# Patient Record
Sex: Female | Born: 1965 | Marital: Married | Smoking: Never smoker
Health system: Southern US, Community
[De-identification: ages and names within clinical notes are randomized; demographics above are authoritative.]

## PROBLEM LIST (undated history)

## (undated) DIAGNOSIS — M1990 Unspecified osteoarthritis, unspecified site: Secondary | ICD-10-CM

## (undated) DIAGNOSIS — I10 Essential (primary) hypertension: Secondary | ICD-10-CM

## (undated) DIAGNOSIS — J189 Pneumonia, unspecified organism: Secondary | ICD-10-CM

## (undated) HISTORY — PX: ABDOMINAL HYSTERECTOMY: SHX81

## (undated) HISTORY — PX: TUBAL LIGATION: SHX77

---

## 2020-06-29 ENCOUNTER — Ambulatory Visit: Payer: Self-pay

## 2020-06-29 ENCOUNTER — Encounter: Payer: Self-pay | Admitting: Orthopaedic Surgery

## 2020-06-29 ENCOUNTER — Ambulatory Visit: Payer: BC Managed Care – PPO | Admitting: Orthopaedic Surgery

## 2020-06-29 VITALS — Ht 68.5 in | Wt 200.0 lb

## 2020-06-29 DIAGNOSIS — M25552 Pain in left hip: Secondary | ICD-10-CM | POA: Diagnosis not present

## 2020-06-29 NOTE — Progress Notes (Signed)
   Office Visit Note   Patient: Shelly Burns           Date of Birth: [birth year]           MRN: [id number] Visit Date: 06/29/2020              Requested by: No referring provider defined for this encounter. PCP: Pcp, No   Assessment & Plan: Visit Diagnoses:  1. Pain in left hip     Plan: Impression is left hip degenerative joint disease.  We discussed cortisone injection for which the patient would like to proceed.  She will follow up with Dr. Prince Rome for this.  Follow-up with [place] as needed.  Follow-Up Instructions: Return for f/u with Dr. Prince Rome for left hip injection.   Orders:  Orders Placed This Encounter  Procedures  . XR HIP UNILAT W OR W/O PELVIS 2-3 VIEWS LEFT   No orders of the defined types were placed in this encounter.     Procedures: No procedures performed   Clinical Data: No additional findings.   Subjective: Chief Complaint  Patient presents with  . Left Hip - Pain    HPI patient is a pleasant 55 year old female who comes in today with left hip pain.  This began approximately 2 years ago without any known injury or change in activity and has recently worsened.  The pain she has is to the groin and anterior thigh but occasionally into the left lateral hip.  She notes that her symptoms are aggravated with walking as well as putting on her shoes and relieve anything where she flexes her hip.  She has taken Tylenol without relief of symptoms.  She denies any numbness, tingling or burning to the left lower extremity.  No previous hip injection.  Review of Systems as detailed in HPI.  All others reviewed and are negative.   Objective: Vital Signs: Ht 5' 8.5" (1.74 m)   Wt 200 lb (90.7 kg)   BMI 29.97 kg/m   Physical Exam well-developed well-nourished female in no acute distress.  Alert oriented x3.  Ortho Exam.  Left hip exam shows a moderately positive logroll and FADIR.  Negative straight leg raise.  No tenderness to the greater trochanter.  She is  neurovascular intact distally.  Specialty Comments:  No specialty comments available.  Imaging: XR HIP UNILAT W OR W/O PELVIS 2-3 VIEWS LEFT  Result Date: 06/29/2020 Moderate to advanced degenerative changes to the left hip joint    PMFS History: There are no problems to display for this patient.  History reviewed. No pertinent past medical history.  History reviewed. No pertinent family history.  History reviewed. No pertinent surgical history. Social History   Occupational History  . Not on file  Tobacco Use  . Smoking status: Not on file  . Smokeless tobacco: Not on file  Substance and Sexual Activity  . Alcohol use: Not on file  . Drug use: Not on file  . Sexual activity: Not on file

## 2020-06-30 ENCOUNTER — Other Ambulatory Visit: Payer: BC Managed Care – PPO

## 2020-06-30 DIAGNOSIS — Z20822 Contact with and (suspected) exposure to covid-19: Secondary | ICD-10-CM

## 2020-07-02 LAB — NOVEL CORONAVIRUS, NAA: SARS-CoV-2, NAA: NOT DETECTED

## 2020-07-02 LAB — SARS-COV-2, NAA 2 DAY TAT

## 2020-07-05 ENCOUNTER — Other Ambulatory Visit: Payer: Self-pay

## 2020-07-05 ENCOUNTER — Encounter: Payer: Self-pay | Admitting: Family Medicine

## 2020-07-05 ENCOUNTER — Ambulatory Visit: Payer: Self-pay

## 2020-07-05 ENCOUNTER — Ambulatory Visit (INDEPENDENT_AMBULATORY_CARE_PROVIDER_SITE_OTHER): Payer: BC Managed Care – PPO | Admitting: Family Medicine

## 2020-07-05 DIAGNOSIS — M25552 Pain in left hip: Secondary | ICD-10-CM | POA: Diagnosis not present

## 2020-07-05 NOTE — Progress Notes (Signed)
Subjective: Patient is here for ultrasound-guided intra-articular left hip injection.   Pain in the groin.   Objective:  Pain with passive IR.  Procedure: Ultrasound guided injection is preferred based studies that show increased duration, increased effect, greater accuracy, decreased procedural pain, increased response rate, and decreased cost with ultrasound guided versus blind injection.   Verbal informed consent obtained.  Time-out conducted.  Noted no overlying erythema, induration, or other signs of local infection. Ultrasound-guided left hip injection: After sterile prep with Betadine, injected 4 cc 0.25% bupivacaine without epinephrine and 6 mg betamethasone using a 22-gauge spinal needle, passing the needle through the iliofemoral ligament into the femoral head/neck junction.  Injectate seen filling joint capsule.  Good immediate relief.

## 2020-08-09 ENCOUNTER — Telehealth: Payer: Self-pay | Admitting: Orthopaedic Surgery

## 2020-08-09 ENCOUNTER — Other Ambulatory Visit: Payer: Self-pay

## 2020-08-09 ENCOUNTER — Ambulatory Visit: Payer: BC Managed Care – PPO | Admitting: Orthopaedic Surgery

## 2020-08-09 DIAGNOSIS — M1612 Unilateral primary osteoarthritis, left hip: Secondary | ICD-10-CM

## 2020-08-09 NOTE — Telephone Encounter (Signed)
Patient called requesting a call back. Patient states she was seen today and was told a pain medication would be called into her pharmacy. Please call patient with updates of this matter. Patient phone number is [phone].

## 2020-08-09 NOTE — Progress Notes (Signed)
   Office Visit Note   Patient: Shelly Burns           Date of Birth: [birth year]           MRN: [id number] Visit Date: 08/09/2020              Requested by: No referring provider defined for this encounter. PCP: Pcp, No   Assessment & Plan: Visit Diagnoses:  1. Unilateral primary osteoarthritis, left hip     Plan: Impression is advanced generative joint disease left hip.  We have discussed treatment options to include total hip arthroplasty now that patient has not had any relief from conservative treatments.  Risk, benefits possible occasions reviewed.  Rehab recovery time discussed.  She has elected to move forward with scheduling for a total hip replacement.  All questions were answered.  Follow-Up Instructions: Return for post-op.   Orders:  No orders of the defined types were placed in this encounter.  No orders of the defined types were placed in this encounter.     Procedures: No procedures performed   Clinical Data: No additional findings.   Subjective: Chief Complaint  Patient presents with  . Left Hip - Pain    HPI patient is a very pleasant 55 year old female who comes in today with recurrent left hip pain.  History of advanced degenerative joint disease.  She was seen by [place] last month for this where she was referred to Dr. Prince Rome for ultrasound-guided cortisone injection.  This significantly helped but only lasted for a day.  The pain she has is to the groin and anterior thigh.  Worse with activity.  She has pain at night while trying to sleep as well.  She is taking over-the-counter pain medication which does not help her symptoms.  Review of Systems as detailed in HPI.  All others reviewed and are negative.   Objective: Vital Signs: There were no vitals taken for this visit.  Physical Exam well-developed well-nourished female no acute distress.  Alert oriented x3.  Ortho Exam severe pain with internal and external rotation with limitation in range  of motion.  Positive Stinchfield sign.  Specialty Comments:  No specialty comments available.  Imaging: No new imaging   PMFS History: There are no problems to display for this patient.  No past medical history on file.  No family history on file.  No past surgical history on file. Social History   Occupational History  . Not on file  Tobacco Use  . Smoking status: Not on file  . Smokeless tobacco: Not on file  Substance and Sexual Activity  . Alcohol use: Not on file  . Drug use: Not on file  . Sexual activity: Not on file

## 2020-08-10 ENCOUNTER — Other Ambulatory Visit: Payer: Self-pay | Admitting: Physician Assistant

## 2020-08-10 ENCOUNTER — Other Ambulatory Visit: Payer: Self-pay

## 2020-08-10 MED ORDER — TRAMADOL HCL 50 MG PO TABS: 50.0000 mg | ORAL_TABLET | Freq: Three times a day (TID) | ORAL | 0 refills | Status: DC | PRN

## 2020-08-10 NOTE — Telephone Encounter (Signed)
Sent in

## 2020-08-10 NOTE — Telephone Encounter (Signed)
[facility] 

## 2020-08-10 NOTE — Telephone Encounter (Signed)
I forgot to send in and I am sorry.  I just pended the rx.  Can you find out what pharmacy and send in?

## 2020-08-11 ENCOUNTER — Telehealth: Payer: Self-pay | Admitting: Orthopaedic Surgery

## 2020-08-11 NOTE — Telephone Encounter (Signed)
Patient submitted medical release form, short term disability, $25.00 cash payment to [employer]. Accepted 08/11/20

## 2020-08-17 ENCOUNTER — Telehealth: Payer: Self-pay

## 2020-08-17 NOTE — Telephone Encounter (Signed)
Patient came into the office today regarding unsigned forms for FMLA she stated the onlt thing the forms need is a signature she is also requesting a refill for tramadol she stated the pharmacy told her the rx for 2/24 had to pre authorized in order for her to receive her medication call back:[phone]

## 2020-08-18 NOTE — Telephone Encounter (Signed)
Shelly Burns (Key: [id number])  Your information has been submitted to [facility]. [employer] [place] will review the request and notify you of the determination decision directly, typically within 72 hours of receiving all information.  You will also receive your request decision electronically. To check for an update later, open this request again from your dashboard.  If [employer] [facility] has not responded within the specified timeframe or if you have any questions about your PA submission, contact [employer] [place] directly at [phone].   PENDING AUTH

## 2020-08-18 NOTE — Telephone Encounter (Signed)
Tammy, Was forms given back to you in yellow folder?

## 2020-08-18 NOTE — Telephone Encounter (Signed)
Called pharm. They state it does need PA. We havent received anything via fax. Shelly Posadas do PA. Pending Auth.

## 2020-08-21 NOTE — Telephone Encounter (Signed)
[employer] faxed forms to [employer] on Friday 3/4. Thanks.

## 2020-08-21 NOTE — Telephone Encounter (Signed)
Shelly Burns (Key: [id number])  This request has received a Cancelled outcome.  This may mean either your patient does not have active coverage with this plan, this authorization was processed as a duplicate request, or an authorization was not needed for this medication.  Note any additional information provided by [facility] at the bottom of this request, and contact [employer] [place] directly for further details.

## 2020-08-21 NOTE — Telephone Encounter (Signed)
Rx sent to wrong pharmacy. States she would like Tramadol sent to [facility]. Can you send it there. Thanks.

## 2020-08-21 NOTE — Telephone Encounter (Signed)
Did another PA it was approved.  [employer] ([employer]) - [id number] traMADol HCl 50MG  tablets     Status: PA Response - Approved  Created: March 4th, 2022 [phone]

## 2020-08-21 NOTE — Telephone Encounter (Signed)
[facility].

## 2020-08-22 ENCOUNTER — Other Ambulatory Visit: Payer: Self-pay | Admitting: Physician Assistant

## 2020-08-22 ENCOUNTER — Other Ambulatory Visit: Payer: Self-pay

## 2020-08-22 MED ORDER — TRAMADOL HCL 50 MG PO TABS: 50.0000 mg | ORAL_TABLET | Freq: Three times a day (TID) | ORAL | 0 refills | Status: DC | PRN

## 2020-08-22 NOTE — Telephone Encounter (Signed)
Sent in

## 2020-08-29 ENCOUNTER — Telehealth: Payer: Self-pay | Admitting: Orthopaedic Surgery

## 2020-08-29 NOTE — Telephone Encounter (Signed)
Received $25.00 cash. Medical records release form and disability paperwork    Forwarding to [facility] today

## 2020-08-29 NOTE — Progress Notes (Signed)
Surgical Instructions    Your procedure is scheduled on 09/04/20.  Report to [facility] Main Entrance "A" at 05:30 A.M., then check in with the Admitting office.  Call this number if you have problems the morning of surgery:  [phone]   If you have any questions prior to your surgery date call [phone]: Open Monday-Friday 8am-4pm    Remember:  Do not eat after midnight the night before your surgery  You may drink clear liquids until 04:30am the morning of your surgery.   Clear liquids allowed are: Water, Non-Citrus Juices (without pulp), Carbonated Beverages, Clear Tea, Black Coffee Only, and Gatorade  Patient Instructions  . The night before surgery:  o No food after midnight. ONLY clear liquids after midnight  . The day of surgery (if you do NOT have diabetes):  o Drink ONE (1) Pre-Surgery Clear Ensure by 4:30am. Drink in one sitting. Do not sip.  o This drink was given to you during your hospital  pre-op appointment visit. o Nothing else to drink after completing the  Pre-Surgery Clear Ensure.       Take these medicines the morning of surgery with A SIP OF WATER  acetaminophen (TYLENOL) traMADol (ULTRAM)   As of today, STOP taking any Aspirin (unless otherwise instructed by your surgeon) Aleve, Naproxen, Ibuprofen, Motrin, Advil, Goody's, BC's, all herbal medications, fish oil, and all vitamins.                     Do not wear jewelry, make up, or nail polish            Do not wear lotions, powders, perfumes/colognes, or deodorant.            Do not shave 48 hours prior to surgery.             Do not bring valuables to the hospital.            [facility] is not responsible for any belongings or valuables.  Do NOT Smoke (Tobacco/Vaping) or drink Alcohol 24 hours prior to your procedure If you use a CPAP at night, you may bring all equipment for your overnight stay.   Contacts, glasses, dentures or bridgework may not be worn into surgery, please bring cases  for these belongings   For patients admitted to the hospital, discharge time will be determined by your treatment team.   Patients discharged the day of surgery will not be allowed to drive home, and someone needs to stay with them for 24 hours.    Special instructions:   [place]- Preparing For Surgery  Before surgery, you can play an important role. Because skin is not sterile, your skin needs to be as free of germs as possible. You can reduce the number of germs on your skin by washing with CHG (chlorahexidine gluconate) Soap before surgery.  CHG is an antiseptic cleaner which kills germs and bonds with the skin to continue killing germs even after washing.    Oral Hygiene is also important to reduce your risk of infection.  Remember - BRUSH YOUR TEETH THE MORNING OF SURGERY WITH YOUR REGULAR TOOTHPASTE  Please do not use if you have an allergy to CHG or antibacterial soaps. If your skin becomes reddened/irritated stop using the CHG.  Do not shave (including legs and underarms) for at least 48 hours prior to first CHG shower. It is OK to shave your face.  Please follow these instructions carefully.   1. Shower  the NIGHT BEFORE SURGERY and the MORNING OF SURGERY  2. If you chose to wash your hair, wash your hair first as usual with your normal shampoo.  3. After you shampoo, rinse your hair and body thoroughly to remove the shampoo.  4. Wash Face and genitals (private parts) with your normal soap.   5.  Shower the NIGHT BEFORE SURGERY and the MORNING OF SURGERY with CHG Soap.   6. Use CHG Soap as you would any other liquid soap. You can apply CHG directly to the skin and wash gently with a scrungie or a clean washcloth.   7. Apply the CHG Soap to your body ONLY FROM THE NECK DOWN.  Do not use on open wounds or open sores. Avoid contact with your eyes, ears, mouth and genitals (private parts). Wash Face and genitals (private parts)  with your normal soap.   8. Wash thoroughly,  paying special attention to the area where your surgery will be performed.  9. Thoroughly rinse your body with warm water from the neck down.  10. DO NOT shower/wash with your normal soap after using and rinsing off the CHG Soap.  11. Pat yourself dry with a CLEAN TOWEL.  12. Wear CLEAN PAJAMAS to bed the night before surgery  13. Place CLEAN SHEETS on your bed the night before your surgery  14. DO NOT SLEEP WITH PETS.   Day of Surgery: Take a shower.  Wear Clean/Comfortable clothing the morning of surgery Do not apply any deodorants/lotions.   Remember to brush your teeth WITH YOUR REGULAR TOOTHPASTE.   Please read over the following fact sheets that you were given.

## 2020-08-30 ENCOUNTER — Ambulatory Visit (HOSPITAL_COMMUNITY)
Admission: RE | Admit: 2020-08-30 | Discharge: 2020-08-30 | Disposition: A | Discharge status: Home / Self Care | Payer: BC Managed Care – PPO | Source: Ambulatory Visit | Attending: Physician Assistant | Admitting: Physician Assistant

## 2020-08-30 ENCOUNTER — Other Ambulatory Visit: Payer: Self-pay

## 2020-08-30 ENCOUNTER — Encounter (HOSPITAL_COMMUNITY)
Admission: RE | Admit: 2020-08-30 | Discharge: 2020-08-30 | Disposition: A | Discharge status: Home / Self Care | Payer: BC Managed Care – PPO | Source: Ambulatory Visit | Attending: Orthopaedic Surgery | Admitting: Orthopaedic Surgery

## 2020-08-30 ENCOUNTER — Telehealth: Payer: Self-pay | Admitting: Orthopaedic Surgery

## 2020-08-30 ENCOUNTER — Encounter (HOSPITAL_COMMUNITY): Payer: Self-pay

## 2020-08-30 ENCOUNTER — Other Ambulatory Visit: Payer: Self-pay | Admitting: Physician Assistant

## 2020-08-30 DIAGNOSIS — M1612 Unilateral primary osteoarthritis, left hip: Secondary | ICD-10-CM

## 2020-08-30 HISTORY — DX: Pneumonia, unspecified organism: J18.9

## 2020-08-30 HISTORY — DX: Unspecified osteoarthritis, unspecified site: M19.90

## 2020-08-30 HISTORY — DX: Essential (primary) hypertension: I10

## 2020-08-30 LAB — CBC WITH DIFFERENTIAL/PLATELET
Abs Immature Granulocytes: 0.03 10*3/uL (ref 0.00–0.07)
Basophils Absolute: 0 10*3/uL (ref 0.0–0.1)
Basophils Relative: 1 %
Eosinophils Absolute: 0.1 10*3/uL (ref 0.0–0.5)
Eosinophils Relative: 2 %
HCT: 39.3 % (ref 36.0–46.0)
Hemoglobin: 13.3 g/dL (ref 12.0–15.0)
Immature Granulocytes: 0 %
Lymphocytes Relative: 26 %
Lymphs Abs: 1.8 10*3/uL (ref 0.7–4.0)
MCH: 30 pg (ref 26.0–34.0)
MCHC: 33.8 g/dL (ref 30.0–36.0)
MCV: 88.7 fL (ref 80.0–100.0)
Monocytes Absolute: 0.4 10*3/uL (ref 0.1–1.0)
Monocytes Relative: 5 %
Neutro Abs: 4.5 10*3/uL (ref 1.7–7.7)
Neutrophils Relative %: 66 %
Platelets: 303 10*3/uL (ref 150–400)
RBC: 4.43 MIL/uL (ref 3.87–5.11)
RDW: 11.9 % (ref 11.5–15.5)
WBC: 6.8 10*3/uL (ref 4.0–10.5)
nRBC: 0 % (ref 0.0–0.2)

## 2020-08-30 LAB — URINALYSIS, ROUTINE W REFLEX MICROSCOPIC
Bilirubin Urine: NEGATIVE
Glucose, UA: NEGATIVE mg/dL
Hgb urine dipstick: NEGATIVE
Ketones, ur: NEGATIVE mg/dL
Leukocytes,Ua: NEGATIVE
Nitrite: NEGATIVE
Protein, ur: NEGATIVE mg/dL
Specific Gravity, Urine: 1.016 (ref 1.005–1.030)
pH: 8 (ref 5.0–8.0)

## 2020-08-30 LAB — COMPREHENSIVE METABOLIC PANEL
ALT: 11 U/L (ref 0–44)
AST: 15 U/L (ref 15–41)
Albumin: 3.5 g/dL (ref 3.5–5.0)
Alkaline Phosphatase: 52 U/L (ref 38–126)
Anion gap: 6 (ref 5–15)
BUN: 11 mg/dL (ref 6–20)
CO2: 25 mmol/L (ref 22–32)
Calcium: 8.9 mg/dL (ref 8.9–10.3)
Chloride: 107 mmol/L (ref 98–111)
Creatinine, Ser: 0.86 mg/dL (ref 0.44–1.00)
GFR, Estimated: >60 mL/min (ref >60)
Glucose, Bld: 102 mg/dL — ABNORMAL HIGH (ref 70–99)
Potassium: 3.5 mmol/L (ref 3.5–5.1)
Sodium: 138 mmol/L (ref 135–145)
Total Bilirubin: 1.1 mg/dL (ref 0.3–1.2)
Total Protein: 7.1 g/dL (ref 6.5–8.1)

## 2020-08-30 LAB — PROTIME-INR
INR: 1 (ref 0.8–1.2)
Prothrombin Time: 13.2 seconds (ref 11.4–15.2)

## 2020-08-30 LAB — SURGICAL PCR SCREEN
MRSA, PCR: NEGATIVE
Staphylococcus aureus: POSITIVE — AB

## 2020-08-30 LAB — APTT: aPTT: 26 seconds (ref 24–36)

## 2020-08-30 LAB — TYPE AND SCREEN
ABO/RH(D): B POS
Antibody Screen: NEGATIVE

## 2020-08-30 MED ORDER — METHOCARBAMOL 500 MG PO TABS: 500.0000 mg | ORAL_TABLET | Freq: Two times a day (BID) | ORAL | 0 refills | Status: DC | PRN

## 2020-08-30 MED ORDER — DOCUSATE SODIUM 100 MG PO CAPS: 100.0000 mg | ORAL_CAPSULE | Freq: Every day | ORAL | 2 refills | Status: AC | PRN

## 2020-08-30 MED ORDER — ONDANSETRON HCL 4 MG PO TABS: 4.0000 mg | ORAL_TABLET | Freq: Three times a day (TID) | ORAL | 0 refills | Status: DC | PRN

## 2020-08-30 MED ORDER — ASPIRIN EC 81 MG PO TBEC: 81.0000 mg | DELAYED_RELEASE_TABLET | Freq: Two times a day (BID) | ORAL | 0 refills | Status: DC

## 2020-08-30 MED ORDER — OXYCODONE-ACETAMINOPHEN 5-325 MG PO TABS: 1.0000 | ORAL_TABLET | Freq: Four times a day (QID) | ORAL | 0 refills | Status: DC | PRN

## 2020-08-30 NOTE — Telephone Encounter (Signed)
Sent to [employer] 3/15. She came in with forms and paid form fee and signed release.

## 2020-08-30 NOTE — Telephone Encounter (Signed)
Called patient she is aware. Said she spoke to someone this AM

## 2020-08-30 NOTE — Telephone Encounter (Signed)
Pt daughter called and states her mother dropped off a packet for her to take a leave from work to take care of her mom! She wants to know if you have had a chance to fill it out and send it to her work! Her work is telling her they have not recieved anything. Her mothers surgery is on Monday the 21st! CB [phone]

## 2020-08-30 NOTE — Progress Notes (Signed)
PCP - Donnal Debar and Carey Bullocks (records requested) Cardiologist - denies  PPM/ICD - denies   Chest x-ray - n/a EKG - 08/30/20 Stress Test - denies ECHO - denies Cardiac Cath - denies  Sleep Study - denies    ERAS Protcol -yes PRE-SURGERY Ensure or G2- ensure give  COVID TEST- 09/01/20   Anesthesia review: yes, blood pressure elevated during PAT  Patient denies shortness of breath, fever, cough and chest pain at PAT appointment   All instructions explained to the patient, with a verbal understanding of the material. Patient agrees to go over the instructions while at home for a better understanding. Patient also instructed to self quarantine after being tested for COVID-19. The opportunity to ask questions was provided.

## 2020-08-30 NOTE — Progress Notes (Signed)
Contacted James, PA-C about elevated blood pressure. Pt states she didn't take her lisinopril-hydrochlorothiazide this am. She doesn't have an established PCP in the area yet. Still being followed by Donnal Debar with [facility] practice in [place], [place]. Pt instructed to take her medicine daily and to check her blood pressure at home and notify Dr. Roda Shutters if diastolic remained over 100.

## 2020-08-30 NOTE — Telephone Encounter (Signed)
I don't have anything back here.  Do you know if she left something with [employer]?

## 2020-08-31 ENCOUNTER — Telehealth: Payer: Self-pay

## 2020-08-31 NOTE — Telephone Encounter (Signed)
Some forms were already filled out by [employer]. Roda Shutters is out so I stamped forms. Left copy at the front desk patient aware. Mel Almond let her know.

## 2020-08-31 NOTE — Anesthesia Preprocedure Evaluation (Addendum)
Anesthesia Evaluation  Patient identified by MRN, date of birth, ID band Patient awake    Reviewed: Allergy & Precautions, NPO status , Patient's Chart, lab work & pertinent test results  History of Anesthesia Complications Negative for: history of anesthetic complications  Airway Mallampati: II  TM Distance: >3 FB Neck ROM: Full    Dental  (+) Dental Advisory Given, Edentulous Upper   Pulmonary neg shortness of breath, neg sleep apnea, neg COPD, neg recent URI,  Covid-19 Nucleic Acid Test Results Lab Results      Component                Value               Date                      SARSCOV2NAA              NEGATIVE            09/01/2020                [place]              Not Detected        06/30/2020              breath sounds clear to auscultation       Cardiovascular hypertension, Pt. on medications  Rhythm:Regular     Neuro/Psych negative neurological ROS  negative psych ROS   GI/Hepatic negative GI ROS, Neg liver ROS,   Endo/Other  negative endocrine ROS  Renal/GU negative Renal ROSLab Results      Component                Value               Date                      CREATININE               0.86                08/30/2020                Musculoskeletal  (+) Arthritis ,   Abdominal   Peds  Hematology negative hematology ROS (+) Lab Results      Component                Value               Date                      WBC                      6.8                 08/30/2020                HGB                      13.3                08/30/2020                HCT                      39.3  08/30/2020                MCV                      88.7                08/30/2020                PLT                      303                 08/30/2020            inr 1 ptt 26   Anesthesia Other Findings   Reproductive/Obstetrics                           Anesthesia  Physical Anesthesia Plan  ASA: II  Anesthesia Plan: MAC and Spinal   Post-op Pain Management:    Induction:   PONV Risk Score and Plan: 2 and Propofol infusion  Airway Management Planned: Nasal Cannula  Additional Equipment: None  Intra-op Plan:   Post-operative Plan:   Informed Consent: I have reviewed the patients History and Physical, chart, labs and discussed the procedure including the risks, benefits and alternatives for the proposed anesthesia with the patient or authorized representative who has indicated his/her understanding and acceptance.     Dental advisory given  Plan Discussed with: CRNA and Surgeon  Anesthesia Plan Comments: (See APP note by Durel Salts, FNP )      Anesthesia Quick Evaluation

## 2020-08-31 NOTE — Telephone Encounter (Signed)
Patient called regarding FMLA forms she dropped off on Tuesday I informed patient Dr.Xu is on vacation this week patient recently called she wasn't informed that Dr.Xu wasn't here this week and is in need of the forms ASAP. Can lindsay fill out the forms? call [phone]

## 2020-08-31 NOTE — Progress Notes (Signed)
Anesthesia Chart Review:   Case: [id number] Date/Time: 09/04/20 0700   Procedure: LEFT TOTAL HIP ARTHROPLASTY ANTERIOR APPROACH (Left Hip)   Anesthesia type: Spinal   Pre-op diagnosis: left hip degenerative joint disease   [facility] ROOM 04 / [facility]   Surgeons: Tarry Kos, MD      DISCUSSION:  Pt is 55 years old with hx HTN.   BP elevated at pre-admission testing: 156/115, and 168/118 on recheck. Pt reports she did not take BP med this day- RN instructed to take daily as prescribed, check BP at home, and notify surgeon Dr. Roda Shutters if diastolic BP >100.   Sherry in Dr. Warren Danes office notified of high BP.    VS: BP (!) 168/118   Pulse 85   Temp 37.1 C (Oral)   Resp 20   Ht 5' 8.5" (1.74 m)   Wt 92.2 kg   LMP 06/18/2015 Comment: partial hysterectomy  SpO2 100%   BMI 30.46 kg/m    PROVIDERS: Pcp, No   LABS: Labs reviewed: Acceptable for surgery. (all labs ordered are listed, but only abnormal results are displayed)  Labs Reviewed  SURGICAL PCR SCREEN - Abnormal; Notable for the following components:      Result Value   Staphylococcus aureus POSITIVE (*)    All other components within normal limits  COMPREHENSIVE METABOLIC PANEL - Abnormal; Notable for the following components:   Glucose, Bld 102 (*)    All other components within normal limits  CBC WITH DIFFERENTIAL/PLATELET  PROTIME-INR  APTT  URINALYSIS, ROUTINE W REFLEX MICROSCOPIC  TYPE AND SCREEN     IMAGES: CXR 08/30/20: No radiographic evidence of acute cardiopulmonary disease.   EKG 08/30/20: NSR   CV: N/A   Past Medical History:  Diagnosis Date  . Arthritis    left hip  . Hypertension   . Pneumonia     Past Surgical History:  Procedure Laterality Date  . ABDOMINAL HYSTERECTOMY     partial hysterectomy 2017  . TUBAL LIGATION      MEDICATIONS: . aspirin EC 81 MG tablet  . docusate sodium (COLACE) 100 MG capsule  . methocarbamol (ROBAXIN) 500 MG tablet  . ondansetron (ZOFRAN) 4 MG  tablet  . oxyCODONE-acetaminophen (PERCOCET) 5-325 MG tablet  . acetaminophen (TYLENOL) 500 MG tablet  . lisinopril-hydrochlorothiazide (ZESTORETIC) 20-25 MG tablet  . traMADol (ULTRAM) 50 MG tablet   No current facility-administered medications for this encounter.    If BP acceptable day of surgery, I anticipate pt can proceed with surgery as scheduled.  Rica Mast, PhD, FNP-BC [facility]/Anesthesiology Phone: [phone] 08/31/2020 1:19 PM

## 2020-09-01 ENCOUNTER — Other Ambulatory Visit (HOSPITAL_COMMUNITY)
Admission: RE | Admit: 2020-09-01 | Discharge: 2020-09-01 | Disposition: A | Discharge status: Home / Self Care | Payer: BC Managed Care – PPO | Source: Ambulatory Visit | Attending: Orthopaedic Surgery | Admitting: Orthopaedic Surgery

## 2020-09-01 DIAGNOSIS — Z01812 Encounter for preprocedural laboratory examination: Secondary | ICD-10-CM | POA: Diagnosis not present

## 2020-09-01 DIAGNOSIS — Z20822 Contact with and (suspected) exposure to covid-19: Secondary | ICD-10-CM | POA: Insufficient documentation

## 2020-09-01 LAB — SARS CORONAVIRUS 2 (TAT 6-24 HRS): SARS Coronavirus 2: NEGATIVE

## 2020-09-01 MED ORDER — TRANEXAMIC ACID 1000 MG/10ML IV SOLN
2000.0000 mg | INTRAVENOUS | Status: DC
  Filled 2020-09-01 (×2): qty 20

## 2020-09-03 DIAGNOSIS — M1612 Unilateral primary osteoarthritis, left hip: Secondary | ICD-10-CM

## 2020-09-04 ENCOUNTER — Ambulatory Visit (HOSPITAL_COMMUNITY): Payer: BC Managed Care – PPO

## 2020-09-04 ENCOUNTER — Encounter (HOSPITAL_COMMUNITY): Payer: Self-pay | Admitting: Orthopaedic Surgery

## 2020-09-04 ENCOUNTER — Other Ambulatory Visit: Payer: Self-pay

## 2020-09-04 ENCOUNTER — Ambulatory Visit (HOSPITAL_COMMUNITY): Payer: BC Managed Care – PPO | Admitting: Physician Assistant

## 2020-09-04 ENCOUNTER — Observation Stay (HOSPITAL_COMMUNITY)
Admission: RE | Admit: 2020-09-04 | Discharge: 2020-09-06 | Disposition: A | Discharge status: Home / Self Care | Payer: BC Managed Care – PPO | Attending: Orthopaedic Surgery | Admitting: Orthopaedic Surgery

## 2020-09-04 ENCOUNTER — Encounter (HOSPITAL_COMMUNITY)
Admission: RE | Disposition: A | Discharge status: Home / Self Care | Payer: Self-pay | Source: Home / Self Care | Attending: Orthopaedic Surgery

## 2020-09-04 ENCOUNTER — Ambulatory Visit (HOSPITAL_COMMUNITY): Payer: BC Managed Care – PPO | Admitting: Anesthesiology

## 2020-09-04 DIAGNOSIS — M1612 Unilateral primary osteoarthritis, left hip: Principal | ICD-10-CM | POA: Insufficient documentation

## 2020-09-04 DIAGNOSIS — Z96649 Presence of unspecified artificial hip joint: Secondary | ICD-10-CM

## 2020-09-04 DIAGNOSIS — Z79899 Other long term (current) drug therapy: Secondary | ICD-10-CM | POA: Diagnosis not present

## 2020-09-04 DIAGNOSIS — Z96642 Presence of left artificial hip joint: Secondary | ICD-10-CM

## 2020-09-04 DIAGNOSIS — Z419 Encounter for procedure for purposes other than remedying health state, unspecified: Secondary | ICD-10-CM

## 2020-09-04 DIAGNOSIS — Z7982 Long term (current) use of aspirin: Secondary | ICD-10-CM | POA: Insufficient documentation

## 2020-09-04 DIAGNOSIS — I10 Essential (primary) hypertension: Secondary | ICD-10-CM | POA: Insufficient documentation

## 2020-09-04 HISTORY — PX: TOTAL HIP ARTHROPLASTY: SHX124

## 2020-09-04 LAB — ABO/RH: ABO/RH(D): B POS

## 2020-09-04 LAB — GLUCOSE, CAPILLARY: Glucose-Capillary: 135 mg/dL — ABNORMAL HIGH (ref 70–99)

## 2020-09-04 SURGERY — ARTHROPLASTY, HIP, TOTAL, ANTERIOR APPROACH
Anesthesia: General | Site: Hip | Laterality: Left

## 2020-09-04 MED ORDER — ACETAMINOPHEN 500 MG PO TABS: 1000.0000 mg | ORAL_TABLET | Freq: Once | ORAL | Status: DC | PRN

## 2020-09-04 MED ORDER — SODIUM CHLORIDE 0.9 % IR SOLN
Status: DC | PRN
  Administered 2020-09-04: 1000 mL

## 2020-09-04 MED ORDER — VANCOMYCIN HCL 1 G IV SOLR
INTRAVENOUS | Status: DC | PRN
  Administered 2020-09-04: 1000 mg via TOPICAL

## 2020-09-04 MED ORDER — ACETAMINOPHEN 10 MG/ML IV SOLN
INTRAVENOUS | Status: AC
  Filled 2020-09-04: qty 100

## 2020-09-04 MED ORDER — ONDANSETRON HCL 4 MG PO TABS: 4.0000 mg | ORAL_TABLET | Freq: Four times a day (QID) | ORAL | Status: DC | PRN

## 2020-09-04 MED ORDER — ONDANSETRON HCL 4 MG/2ML IJ SOLN
INTRAMUSCULAR | Status: DC | PRN
  Administered 2020-09-04: 4 mg via INTRAVENOUS

## 2020-09-04 MED ORDER — PHENYLEPHRINE HCL-NACL 10-0.9 MG/250ML-% IV SOLN
INTRAVENOUS | Status: DC | PRN
  Administered 2020-09-04: 25 ug/min via INTRAVENOUS

## 2020-09-04 MED ORDER — POVIDONE-IODINE 10 % EX SWAB
2.0000 "application " | Freq: Once | CUTANEOUS | Status: AC
  Administered 2020-09-04: 2 via TOPICAL

## 2020-09-04 MED ORDER — CEFAZOLIN SODIUM-DEXTROSE 2-4 GM/100ML-% IV SOLN
INTRAVENOUS | Status: AC
  Filled 2020-09-04: qty 100

## 2020-09-04 MED ORDER — ONDANSETRON HCL 4 MG/2ML IJ SOLN
INTRAMUSCULAR | Status: AC
  Filled 2020-09-04: qty 2

## 2020-09-04 MED ORDER — CHLORHEXIDINE GLUCONATE 0.12 % MT SOLN
OROMUCOSAL | Status: AC
  Administered 2020-09-04: 15 mL via OROMUCOSAL
  Filled 2020-09-04: qty 15

## 2020-09-04 MED ORDER — POLYETHYLENE GLYCOL 3350 17 G PO PACK
17.0000 g | PACK | Freq: Every day | ORAL | Status: DC
  Administered 2020-09-04 – 2020-09-05 (×2): 17 g via ORAL
  Filled 2020-09-04 (×2): qty 1

## 2020-09-04 MED ORDER — DEXAMETHASONE SODIUM PHOSPHATE 10 MG/ML IJ SOLN
10.0000 mg | Freq: Once | INTRAMUSCULAR | Status: AC
  Administered 2020-09-05: 10 mg via INTRAVENOUS
  Filled 2020-09-04: qty 1

## 2020-09-04 MED ORDER — LISINOPRIL 20 MG PO TABS
20.0000 mg | ORAL_TABLET | Freq: Every day | ORAL | Status: DC
  Administered 2020-09-04 – 2020-09-05 (×2): 20 mg via ORAL
  Filled 2020-09-04 (×2): qty 1

## 2020-09-04 MED ORDER — FENTANYL CITRATE (PF) 250 MCG/5ML IJ SOLN
INTRAMUSCULAR | Status: AC
  Filled 2020-09-04: qty 5

## 2020-09-04 MED ORDER — FENTANYL CITRATE (PF) 100 MCG/2ML IJ SOLN
INTRAMUSCULAR | Status: AC
  Administered 2020-09-04: 50 ug via INTRAVENOUS
  Filled 2020-09-04: qty 2

## 2020-09-04 MED ORDER — DEXAMETHASONE SODIUM PHOSPHATE 10 MG/ML IJ SOLN
INTRAMUSCULAR | Status: DC | PRN
  Administered 2020-09-04: 10 mg via INTRAVENOUS

## 2020-09-04 MED ORDER — TRANEXAMIC ACID-NACL 1000-0.7 MG/100ML-% IV SOLN
INTRAVENOUS | Status: AC
  Filled 2020-09-04: qty 100

## 2020-09-04 MED ORDER — METOCLOPRAMIDE HCL 5 MG PO TABS: 5.0000 mg | ORAL_TABLET | Freq: Three times a day (TID) | ORAL | Status: DC | PRN

## 2020-09-04 MED ORDER — SODIUM CHLORIDE 0.9 % IV SOLN: INTRAVENOUS | Status: DC

## 2020-09-04 MED ORDER — OXYCODONE HCL 5 MG PO TABS
ORAL_TABLET | ORAL | Status: AC
  Administered 2020-09-04: 5 mg via ORAL
  Filled 2020-09-04: qty 1

## 2020-09-04 MED ORDER — METOCLOPRAMIDE HCL 5 MG/ML IJ SOLN
5.0000 mg | Freq: Three times a day (TID) | INTRAMUSCULAR | Status: DC | PRN
Start: 2020-09-04 — End: 2020-09-06

## 2020-09-04 MED ORDER — ACETAMINOPHEN 160 MG/5ML PO SOLN: 1000.0000 mg | Freq: Once | ORAL | Status: DC | PRN

## 2020-09-04 MED ORDER — BUPIVACAINE-MELOXICAM ER 400-12 MG/14ML IJ SOLN
INTRAMUSCULAR | Status: DC | PRN
  Administered 2020-09-04: 400 mg

## 2020-09-04 MED ORDER — IRRISEPT - 450ML BOTTLE WITH 0.05% CHG IN STERILE WATER, USP 99.95% OPTIME
TOPICAL | Status: DC | PRN
  Administered 2020-09-04: 450 mL

## 2020-09-04 MED ORDER — PROPOFOL 10 MG/ML IV BOLUS
INTRAVENOUS | Status: DC | PRN
  Administered 2020-09-04: 10 mg via INTRAVENOUS
  Administered 2020-09-04: 50 mg via INTRAVENOUS
  Administered 2020-09-04: 20 mg via INTRAVENOUS
  Administered 2020-09-04: 100 mg via INTRAVENOUS

## 2020-09-04 MED ORDER — SORBITOL 70 % SOLN
30.0000 mL | Freq: Every day | Status: DC | PRN
Start: 2020-09-04 — End: 2020-09-06
  Filled 2020-09-04: qty 30

## 2020-09-04 MED ORDER — CHLORHEXIDINE GLUCONATE 0.12 % MT SOLN: 15.0000 mL | OROMUCOSAL | Status: AC

## 2020-09-04 MED ORDER — FENTANYL CITRATE (PF) 100 MCG/2ML IJ SOLN
INTRAMUSCULAR | Status: DC | PRN
  Administered 2020-09-04 (×4): 50 ug via INTRAVENOUS

## 2020-09-04 MED ORDER — ACETAMINOPHEN 10 MG/ML IV SOLN
1000.0000 mg | Freq: Once | INTRAVENOUS | Status: DC | PRN
  Administered 2020-09-04: 1000 mg via INTRAVENOUS

## 2020-09-04 MED ORDER — ONDANSETRON HCL 4 MG/2ML IJ SOLN: 4.0000 mg | Freq: Four times a day (QID) | INTRAMUSCULAR | Status: DC | PRN

## 2020-09-04 MED ORDER — OXYCODONE HCL 5 MG PO TABS
5.0000 mg | ORAL_TABLET | Freq: Once | ORAL | Status: AC | PRN
Start: 2020-09-04 — End: 2020-09-04

## 2020-09-04 MED ORDER — 0.9 % SODIUM CHLORIDE (POUR BTL) OPTIME
TOPICAL | Status: DC | PRN
  Administered 2020-09-04: 1000 mL

## 2020-09-04 MED ORDER — PHENOL 1.4 % MT LIQD: 1.0000 | OROMUCOSAL | Status: DC | PRN

## 2020-09-04 MED ORDER — HYDROMORPHONE HCL 1 MG/ML IJ SOLN
INTRAMUSCULAR | Status: AC
  Filled 2020-09-04: qty 1

## 2020-09-04 MED ORDER — FENTANYL CITRATE (PF) 100 MCG/2ML IJ SOLN
25.0000 ug | INTRAMUSCULAR | Status: DC | PRN
  Administered 2020-09-04: 50 ug via INTRAVENOUS

## 2020-09-04 MED ORDER — ACETAMINOPHEN 325 MG PO TABS: 325.0000 mg | ORAL_TABLET | Freq: Four times a day (QID) | ORAL | Status: DC | PRN

## 2020-09-04 MED ORDER — MIDAZOLAM HCL 5 MG/5ML IJ SOLN
INTRAMUSCULAR | Status: DC | PRN
  Administered 2020-09-04: 2 mg via INTRAVENOUS

## 2020-09-04 MED ORDER — CEFAZOLIN SODIUM-DEXTROSE 2-4 GM/100ML-% IV SOLN
2.0000 g | Freq: Four times a day (QID) | INTRAVENOUS | Status: AC
  Administered 2020-09-05: 2 g via INTRAVENOUS
  Filled 2020-09-04 (×2): qty 100

## 2020-09-04 MED ORDER — VANCOMYCIN HCL 1000 MG IV SOLR
INTRAVENOUS | Status: AC
  Filled 2020-09-04: qty 1000

## 2020-09-04 MED ORDER — OXYCODONE HCL 5 MG PO TABS
10.0000 mg | ORAL_TABLET | ORAL | Status: DC | PRN
  Administered 2020-09-05: 15 mg via ORAL
  Filled 2020-09-04: qty 3

## 2020-09-04 MED ORDER — CEFAZOLIN SODIUM-DEXTROSE 2-4 GM/100ML-% IV SOLN
2.0000 g | INTRAVENOUS | Status: AC
  Administered 2020-09-04: 2 g via INTRAVENOUS

## 2020-09-04 MED ORDER — HYDROCHLOROTHIAZIDE 25 MG PO TABS
25.0000 mg | ORAL_TABLET | Freq: Every day | ORAL | Status: DC
  Administered 2020-09-04 – 2020-09-05 (×2): 25 mg via ORAL
  Filled 2020-09-04 (×2): qty 1

## 2020-09-04 MED ORDER — BUPIVACAINE-MELOXICAM ER 200-6 MG/7ML IJ SOLN
INTRAMUSCULAR | Status: AC
  Filled 2020-09-04: qty 2

## 2020-09-04 MED ORDER — TRANEXAMIC ACID 1000 MG/10ML IV SOLN
INTRAVENOUS | Status: DC | PRN
  Administered 2020-09-04: 2000 mg via TOPICAL

## 2020-09-04 MED ORDER — DOCUSATE SODIUM 100 MG PO CAPS
100.0000 mg | ORAL_CAPSULE | Freq: Two times a day (BID) | ORAL | Status: DC
  Administered 2020-09-04 – 2020-09-05 (×3): 100 mg via ORAL
  Filled 2020-09-04 (×3): qty 1

## 2020-09-04 MED ORDER — DIPHENHYDRAMINE HCL 12.5 MG/5ML PO ELIX: 25.0000 mg | ORAL_SOLUTION | ORAL | Status: DC | PRN

## 2020-09-04 MED ORDER — DEXAMETHASONE SODIUM PHOSPHATE 10 MG/ML IJ SOLN
INTRAMUSCULAR | Status: AC
  Filled 2020-09-04: qty 1

## 2020-09-04 MED ORDER — LISINOPRIL-HYDROCHLOROTHIAZIDE 20-25 MG PO TABS: 1.0000 | ORAL_TABLET | Freq: Every day | ORAL | Status: DC

## 2020-09-04 MED ORDER — ASPIRIN 81 MG PO CHEW
81.0000 mg | CHEWABLE_TABLET | Freq: Two times a day (BID) | ORAL | Status: DC
  Administered 2020-09-04 – 2020-09-05 (×3): 81 mg via ORAL
  Filled 2020-09-04 (×3): qty 1

## 2020-09-04 MED ORDER — OXYCODONE HCL 5 MG PO TABS: 5.0000 mg | ORAL_TABLET | ORAL | Status: DC | PRN

## 2020-09-04 MED ORDER — HYDROMORPHONE HCL 1 MG/ML IJ SOLN
0.5000 mg | INTRAMUSCULAR | Status: DC | PRN
  Administered 2020-09-04: 0.5 mg via INTRAVENOUS
  Administered 2020-09-04: 1 mg via INTRAVENOUS
  Filled 2020-09-04 (×2): qty 1

## 2020-09-04 MED ORDER — TRANEXAMIC ACID-NACL 1000-0.7 MG/100ML-% IV SOLN
1000.0000 mg | INTRAVENOUS | Status: AC
  Administered 2020-09-04: 1000 mg via INTRAVENOUS

## 2020-09-04 MED ORDER — TRANEXAMIC ACID-NACL 1000-0.7 MG/100ML-% IV SOLN: 1000.0000 mg | Freq: Once | INTRAVENOUS | Status: DC

## 2020-09-04 MED ORDER — MENTHOL 3 MG MT LOZG: 1.0000 | LOZENGE | OROMUCOSAL | Status: DC | PRN

## 2020-09-04 MED ORDER — METHOCARBAMOL 1000 MG/10ML IJ SOLN
500.0000 mg | Freq: Four times a day (QID) | INTRAVENOUS | Status: DC | PRN
  Filled 2020-09-04: qty 5

## 2020-09-04 MED ORDER — ACETAMINOPHEN 500 MG PO TABS
1000.0000 mg | ORAL_TABLET | Freq: Four times a day (QID) | ORAL | Status: AC
  Administered 2020-09-04 – 2020-09-05 (×3): 1000 mg via ORAL
  Filled 2020-09-04 (×3): qty 2

## 2020-09-04 MED ORDER — MAGNESIUM CITRATE PO SOLN: 1.0000 | Freq: Once | ORAL | Status: DC | PRN

## 2020-09-04 MED ORDER — LACTATED RINGERS IV SOLN: INTRAVENOUS | Status: DC

## 2020-09-04 MED ORDER — OXYCODONE HCL 5 MG/5ML PO SOLN: 5.0000 mg | Freq: Once | ORAL | Status: AC | PRN

## 2020-09-04 MED ORDER — ALUM & MAG HYDROXIDE-SIMETH 200-200-20 MG/5ML PO SUSP: 30.0000 mL | ORAL | Status: DC | PRN

## 2020-09-04 MED ORDER — METHOCARBAMOL 500 MG PO TABS
500.0000 mg | ORAL_TABLET | Freq: Four times a day (QID) | ORAL | Status: DC | PRN
  Administered 2020-09-04: 500 mg via ORAL
  Filled 2020-09-04: qty 1

## 2020-09-04 MED ORDER — PROPOFOL 500 MG/50ML IV EMUL
INTRAVENOUS | Status: DC | PRN
  Administered 2020-09-04: 75 ug/kg/min via INTRAVENOUS

## 2020-09-04 MED ORDER — LIDOCAINE 2% (20 MG/ML) 5 ML SYRINGE
INTRAMUSCULAR | Status: AC
  Filled 2020-09-04: qty 5

## 2020-09-04 MED ORDER — MIDAZOLAM HCL 2 MG/2ML IJ SOLN
INTRAMUSCULAR | Status: AC
  Filled 2020-09-04: qty 2

## 2020-09-04 SURGICAL SUPPLY — 64 items
BAG DECANTER FOR FLEXI CONT (MISCELLANEOUS) ×2 IMPLANT
BLADE SAW SGTL 18X1.27X75 (BLADE) IMPLANT
CELLS DAT CNTRL 66122 CELL SVR (MISCELLANEOUS) IMPLANT
COVER PERINEAL POST (MISCELLANEOUS) ×2 IMPLANT
COVER SURGICAL LIGHT HANDLE (MISCELLANEOUS) ×2 IMPLANT
COVER WAND RF STERILE (DRAPES) IMPLANT
CUP SECTOR GRIPTON 50MM (Cup) ×2 IMPLANT
DERMABOND ADVANCED (GAUZE/BANDAGES/DRESSINGS) ×1
DERMABOND ADVANCED .7 DNX12 (GAUZE/BANDAGES/DRESSINGS) ×1 IMPLANT
DRAPE C-ARM 42X72 X-RAY (DRAPES) ×2 IMPLANT
DRAPE POUCH INSTRU U-SHP 10X18 (DRAPES) ×2 IMPLANT
DRAPE STERI IOBAN 125X83 (DRAPES) ×2 IMPLANT
DRAPE U-SHAPE 47X51 STRL (DRAPES) ×4 IMPLANT
DRSG AQUACEL AG ADV 3.5X10 (GAUZE/BANDAGES/DRESSINGS) ×2 IMPLANT
DURAPREP 26ML APPLICATOR (WOUND CARE) ×4 IMPLANT
ELECT BLADE 4.0 EZ CLEAN MEGAD (MISCELLANEOUS) ×2
ELECT REM PT RETURN 9FT ADLT (ELECTROSURGICAL) ×2
ELECTRODE BLDE 4.0 EZ CLN MEGD (MISCELLANEOUS) ×1 IMPLANT
ELECTRODE REM PT RTRN 9FT ADLT (ELECTROSURGICAL) ×1 IMPLANT
GLOVE ECLIPSE 7.0 STRL STRAW (GLOVE) ×4 IMPLANT
GLOVE SKINSENSE NS SZ7.5 (GLOVE) ×1
GLOVE SKINSENSE STRL SZ7.5 (GLOVE) ×1 IMPLANT
GLOVE SURG SYN 7.5  E (GLOVE) ×4
GLOVE SURG SYN 7.5 E (GLOVE) ×4 IMPLANT
GLOVE SURG UNDER POLY LF SZ7 (GLOVE) ×2 IMPLANT
GOWN STRL REIN XL XLG (GOWN DISPOSABLE) ×2 IMPLANT
GOWN STRL REUS W/ TWL LRG LVL3 (GOWN DISPOSABLE) IMPLANT
GOWN STRL REUS W/ TWL XL LVL3 (GOWN DISPOSABLE) ×1 IMPLANT
GOWN STRL REUS W/TWL LRG LVL3 (GOWN DISPOSABLE)
GOWN STRL REUS W/TWL XL LVL3 (GOWN DISPOSABLE) ×1
HANDPIECE INTERPULSE COAX TIP (DISPOSABLE) ×1
HEAD FEMORAL 32 CERAMIC (Hips) ×2 IMPLANT
HOOD PEEL AWAY FLYTE STAYCOOL (MISCELLANEOUS) ×4 IMPLANT
IV NS IRRIG 3000ML ARTHROMATIC (IV SOLUTION) ×2 IMPLANT
JET LAVAGE IRRISEPT WOUND (IRRIGATION / IRRIGATOR) ×2
KIT BASIN OR (CUSTOM PROCEDURE TRAY) ×2 IMPLANT
LAVAGE JET IRRISEPT WOUND (IRRIGATION / IRRIGATOR) ×1 IMPLANT
LINER ACET PNNCL PLUS4 NEUTRAL (Hips) ×1 IMPLANT
MARKER SKIN DUAL TIP RULER LAB (MISCELLANEOUS) ×4 IMPLANT
NEEDLE SPNL 18GX3.5 QUINCKE PK (NEEDLE) ×2 IMPLANT
PACK TOTAL JOINT (CUSTOM PROCEDURE TRAY) ×2 IMPLANT
PACK UNIVERSAL I (CUSTOM PROCEDURE TRAY) ×2 IMPLANT
PAD COLD SHLDR UNI WRAP-ON (PAD) ×2
PAD COLD UNI WRAP-ON (PAD) ×1 IMPLANT
PINNACLE PLUS 4 NEUTRAL (Hips) ×2 IMPLANT
RTRCTR WOUND ALEXIS 18CM MED (MISCELLANEOUS)
SAW OSC TIP CART 19.5X105X1.3 (SAW) ×2 IMPLANT
SET HNDPC FAN SPRY TIP SCT (DISPOSABLE) ×1 IMPLANT
STAPLER VISISTAT 35W (STAPLE) IMPLANT
STEM FEM ACTIS STD SZ2 (Stem) ×2 IMPLANT
SUT ETHIBOND 2 V 37 (SUTURE) ×2 IMPLANT
SUT ETHILON 2 0 FS 18 (SUTURE) ×6 IMPLANT
SUT VIC AB 0 CT1 27 (SUTURE) ×2
SUT VIC AB 0 CT1 27XBRD ANBCTR (SUTURE) ×2 IMPLANT
SUT VIC AB 1 CTX 36 (SUTURE) ×1
SUT VIC AB 1 CTX36XBRD ANBCTR (SUTURE) ×1 IMPLANT
SUT VIC AB 2-0 CT1 27 (SUTURE) ×2
SUT VIC AB 2-0 CT1 TAPERPNT 27 (SUTURE) ×2 IMPLANT
SYR 50ML LL SCALE MARK (SYRINGE) ×2 IMPLANT
TOWEL GREEN STERILE (TOWEL DISPOSABLE) ×2 IMPLANT
TRAY CATH 16FR W/PLASTIC CATH (SET/KITS/TRAYS/PACK) IMPLANT
TRAY FOLEY W/BAG SLVR 16FR (SET/KITS/TRAYS/PACK) ×1
TRAY FOLEY W/BAG SLVR 16FR ST (SET/KITS/TRAYS/PACK) ×1 IMPLANT
YANKAUER SUCT BULB TIP NO VENT (SUCTIONS) ×2 IMPLANT

## 2020-09-04 NOTE — Anesthesia Procedure Notes (Signed)
Procedure Name: MAC Date/Time: 09/04/2020 10:40 AM Performed by: Jenne Campus, CRNA Pre-anesthesia Checklist: Patient identified, Emergency Drugs available, Suction available and Patient being monitored Oxygen Delivery Method: Simple face mask

## 2020-09-04 NOTE — Op Note (Signed)
LEFT TOTAL HIP ARTHROPLASTY ANTERIOR APPROACH  Procedure Note Shelly Burns   [id number]  Pre-op Diagnosis: left hip degenerative joint disease     Post-op Diagnosis: same   Operative Procedures  1. Total hip replacement; Left hip; uncemented [id number]   Surgeon: Gershon Mussel, M.D.  Assist: Oneal Grout, PA-C   Anesthesia: spinal, general LMA  Prosthesis: Depuy Acetabulum: Pinnacle 50 mm Femur: Actis 2 STD Head: 32 mm size: +1 Liner: +4  Bearing Type: ceramic on poly  Total Hip Arthroplasty (Anterior Approach) Op Note:  After informed consent was obtained and the operative extremity marked in the holding area, the patient was brought back to the operating room and placed supine on the HANA table. Next, the operative extremity was prepped and draped in normal sterile fashion. Surgical timeout occurred verifying patient identification, surgical site, surgical procedure and administration of antibiotics.  A modified anterior Smith-Peterson approach to the hip was performed, using the interval between tensor fascia lata and sartorius.  Dissection was carried bluntly down onto the anterior hip capsule. The lateral femoral circumflex vessels were identified and coagulated. A capsulotomy was performed and the capsular flaps tagged for later repair.  The neck osteotomy was performed. The femoral head was removed which showed severe wear, the acetabular rim was cleared of soft tissue and attention was turned to reaming the acetabulum.  Sequential reaming was performed under fluoroscopic guidance. We reamed to a size 49 mm, and then impacted the acetabular shell. The liner was then placed after irrigation and attention turned to the femur.  After placing the femoral hook, the leg was taken to externally rotated, extended and adducted position taking care to perform soft tissue releases to allow for adequate mobilization of the femur. Soft tissue was cleared from the shoulder of the  greater trochanter and the hook elevator used to improve exposure of the proximal femur. Sequential broaching performed up to a size 2. Trial neck and head were placed. The leg was brought back up to neutral and the construct reduced.  Antibiotic irrigation was placed in the surgical wound and kept for at least 1 minute.  The position and sizing of components, offset and leg lengths were checked using fluoroscopy. Stability of the construct was checked in extension and external rotation without any subluxation or impingement of prosthesis. We dislocated the prosthesis, dropped the leg back into position, removed trial components, and irrigated copiously. The final stem and head was then placed, the leg brought back up, the system reduced and fluoroscopy used to verify positioning.  We irrigated, obtained hemostasis and closed the capsule using #2 ethibond suture.  One gram of vancomycin powder was placed in the surgical bed. A topical mixture of 0.25% bupivacaine and meloxicam was placed in the hip joint.  One gram of topical tranexamic acid was injected into the joint.  The fascia was closed with #1 vicryl plus, the deep fat layer was closed with 0 vicryl, the subcutaneous layers closed with 2.0 Vicryl Plus and the skin closed with 2.0 nylon and dermabond. A sterile dressing was applied. The patient was awakened in the operating room and taken to recovery in stable condition.  All sponge, needle, and instrument counts were correct at the end of the case.   Tessa Lerner, my PA, was a medical necessity for opening, closing, limb positioning, retracting, exposing, and overall facilitation and timely completion of the surgery.  Position: supine  Complications: see description of procedure.  Time Out: performed   Drains/Packing: none  Estimated blood loss: see anesthesia record  Returned to Recovery Room: in good condition.   Antibiotics: yes   Mechanical VTE (DVT) Prophylaxis: sequential  compression devices, TED thigh-high  Chemical VTE (DVT) Prophylaxis: aspirin   Fluid Replacement: see anesthesia record  Specimens Removed: 1 to pathology   Sponge and Instrument Count Correct? yes   PACU: portable radiograph - low AP   Plan/RTC: Return in 2 weeks for staple removal. Weight Bearing/Load Lower Extremity: full  Hip precautions: none Suture Removal: 2 weeks   N. Glee Arvin, MD [facility] 11:50 AM   Implant Name Type Inv. Item Serial No. Manufacturer Lot No. LRB No. Used Action  CUP SECTOR GRIPTON - SN/A Cup CUP SECTOR GRIPTON N/A [employer] [id number] Left 1 Implanted  PINNACLE PLUS 4 NEUTRAL - SN/A Hips PINNACLE PLUS 4 NEUTRAL N/A [employer] [id number] Left 1 Implanted  STEM FEM ACTIS STD SZ2 - SN/A Stem STEM FEM ACTIS STD SZ2 N/A [employer] [id number] Left 1 Implanted  HEAD FEMORAL 32 CERAMIC - SN/A Hips HEAD FEMORAL 32 CERAMIC N/A [employer] [id number] Left 1 Implanted

## 2020-09-04 NOTE — Anesthesia Procedure Notes (Signed)
Procedure Name: LMA Insertion Date/Time: 09/04/2020 11:12 AM Performed by: Lovie Chol, CRNA Pre-anesthesia Checklist: Patient identified, Emergency Drugs available, Suction available and Patient being monitored Patient Re-evaluated:Patient Re-evaluated prior to induction Oxygen Delivery Method: Circle System Utilized Preoxygenation: Pre-oxygenation with 100% oxygen Induction Type: IV induction Ventilation: Mask ventilation without difficulty LMA: LMA inserted LMA Size: 4.0 Number of attempts: 1 Airway Equipment and Method: Bite block Placement Confirmation: positive ETCO2 Tube secured with: Tape Dental Injury: Teeth and Oropharynx as per pre-operative assessment

## 2020-09-04 NOTE — Evaluation (Signed)
Physical Therapy Evaluation Patient Details Name: Shelly Burns MRN: [id number] DOB: 09/17/1965 Today's Date: 09/04/2020   History of Present Illness  Pt adm for direct anterior lt THR on 09/04/20. PMH - arthritis, HTN  Clinical Impression  Pt admitted with above diagnosis and presents to PT with functional limitations due to deficits listed below (See PT problem list). Pt needs skilled PT to maximize independence and safety to allow discharge to home with family support. Pt limited today by post op nausea.      Follow Up Recommendations Follow surgeon's recommendation for DC plan and follow-up therapies    Equipment Recommendations  None recommended by PT    Recommendations for Other Services       Precautions / Restrictions        Mobility  Bed Mobility Overal bed mobility: Needs Assistance Bed Mobility: Supine to Sit;Sit to Supine     Supine to sit: Min assist Sit to supine: Min assist   General bed mobility comments: Assist to move LLE off of bed and to elevate trunk into sitting. Assist to bring LLE back into bed returning to supine.    Transfers Overall transfer level: Needs assistance Equipment used: Rolling walker (2 wheeled) Transfers: Sit to/from Stand Sit to Stand: Min assist         General transfer comment: Assist to bring hips up and verbal cues for hand placement  Ambulation/Gait Ambulation/Gait assistance: Min assist Gait Distance (Feet): 1 Feet Assistive device: Rolling walker (2 wheeled) Gait Pattern/deviations: Step-to pattern Gait velocity: decr Gait velocity interpretation: <1.31 ft/sec, indicative of household ambulator General Gait Details: Side stepped 2 steps up side of bed. Pt limited by nausea and lt knee buckling  Stairs            Wheelchair Mobility    Modified Rankin (Stroke Patients Only)       Balance Overall balance assessment: No apparent balance deficits (not formally assessed)                                            Pertinent Vitals/Pain Pain Assessment: 0-10 Pain Score: 10-Worst pain ever Pain Location: lt hip Pain Descriptors / Indicators: Grimacing;Guarding Pain Intervention(s): Limited activity within patient's tolerance;Monitored during session;Repositioned;Patient requesting pain meds-RN notified    Home Living Family/patient expects to be discharged to:: Private residence Living Arrangements: Children;Parent Available Help at Discharge: Family;Available 24 hours/day (daughter taking a leave from work) Type of Home: House Home Access: Stairs to enter Entrance Stairs-Rails: Right Entrance Stairs-Number of Steps: 3-4 Home Layout: One level Home Equipment: Toilet riser;Walker - 2 wheels      Prior Function Level of Independence: Independent               Hand Dominance        Extremity/Trunk Assessment   Upper Extremity Assessment Upper Extremity Assessment: Overall WFL for tasks assessed    Lower Extremity Assessment Lower Extremity Assessment: LLE deficits/detail LLE Deficits / Details: limited by pain       Communication   Communication: No difficulties  Cognition Arousal/Alertness: Awake/alert Behavior During Therapy: WFL for tasks assessed/performed Overall Cognitive Status: Within Functional Limits for tasks assessed  General Comments      Exercises     Assessment/Plan    PT Assessment Patient needs continued PT services  PT Problem List Decreased strength;Decreased activity tolerance;Decreased mobility;Decreased knowledge of use of DME;Pain       PT Treatment Interventions DME instruction;Gait training;Stair training;Functional mobility training;Therapeutic activities;Therapeutic exercise;Patient/family education    PT Goals (Current goals can be found in the Care Plan section)  Acute Rehab PT Goals Patient Stated Goal: return home PT Goal Formulation: With  patient Time For Goal Achievement: 09/11/20 Potential to Achieve Goals: Good    Frequency 7X/week   Barriers to discharge Inaccessible home environment stairs to enter    Co-evaluation               AM-PAC PT "6 Clicks" Mobility  Outcome Measure Help needed turning from your back to your side while in a flat bed without using bedrails?: A Little Help needed moving from lying on your back to sitting on the side of a flat bed without using bedrails?: A Little Help needed moving to and from a bed to a chair (including a wheelchair)?: A Little Help needed standing up from a chair using your arms (e.g., wheelchair or bedside chair)?: A Little Help needed to walk in hospital room?: A Little Help needed climbing 3-5 steps with a railing? : A Lot 6 Click Score: 17    End of Session Equipment Utilized During Treatment: Gait belt Activity Tolerance: Other (comment) (limited by nausea) Patient left: in bed;with call bell/phone within reach Nurse Communication: Mobility status;Patient requests pain meds PT Visit Diagnosis: Other abnormalities of gait and mobility (R26.89);Pain Pain - Right/Left: Left Pain - part of body: Hip    Time: 1721-1751 PT Time Calculation (min) (ACUTE ONLY): 30 min   Charges:   PT Evaluation $PT Eval Moderate Complexity: 1 Mod PT Treatments $Therapeutic Activity: 8-22 mins        [facility] PT Acute Rehabilitation Services Pager [phone] Office [phone]   Angelina Ok [facility] 09/04/2020, 6:21 PM

## 2020-09-04 NOTE — Discharge Instructions (Signed)

## 2020-09-04 NOTE — H&P (Signed)
PREOPERATIVE H&P  Chief Complaint: left hip degenerative joint disease  HPI: Shelly Burns is a 55 y.o. female who presents for surgical treatment of left hip degenerative joint disease.  She denies any changes in medical history.  Past Medical History:  Diagnosis Date  . Arthritis    left hip  . Hypertension   . Pneumonia    Past Surgical History:  Procedure Laterality Date  . ABDOMINAL HYSTERECTOMY     partial hysterectomy 2017  . TUBAL LIGATION     Social History   Socioeconomic History  . Marital status: Married    Spouse name: Not on file  . Number of children: Not on file  . Years of education: Not on file  . Highest education level: Not on file  Occupational History  . Not on file  Tobacco Use  . Smoking status: Never Smoker  . Smokeless tobacco: Never Used  Vaping Use  . Vaping Use: Never used  Substance and Sexual Activity  . Alcohol use: Never  . Drug use: Never  . Sexual activity: Not on file  Other Topics Concern  . Not on file  Social History Narrative  . Not on file   Social Determinants of Health   Financial Resource Strain: Not on file  Food Insecurity: Not on file  Transportation Needs: Not on file  Physical Activity: Not on file  Stress: Not on file  Social Connections: Not on file   History reviewed. No pertinent family history. No Known Allergies Prior to Admission medications   Medication Sig Start Date End Date Taking? Authorizing Provider  acetaminophen (TYLENOL) 500 MG tablet Take 500 mg by mouth every 6 (six) hours as needed.   Yes [provider]  aspirin EC 81 MG tablet Take 1 tablet (81 mg total) by mouth 2 (two) times daily. To be taken after surgery 08/30/20   Cristie Hem, PA-C  docusate sodium (COLACE) 100 MG capsule Take 1 capsule (100 mg total) by mouth daily as needed. 08/30/20 08/30/21  Cristie Hem, PA-C  lisinopril-hydrochlorothiazide (ZESTORETIC) 20-25 MG tablet Take 1 tablet by mouth daily.  04/03/20  Yes [provider]  methocarbamol (ROBAXIN) 500 MG tablet Take 1 tablet (500 mg total) by mouth 2 (two) times daily as needed. To be taken after surgery 08/30/20   Cristie Hem, PA-C  ondansetron (ZOFRAN) 4 MG tablet Take 1 tablet (4 mg total) by mouth every 8 (eight) hours as needed for nausea or vomiting. 08/30/20   Cristie Hem, PA-C  oxyCODONE-acetaminophen (PERCOCET) 5-325 MG tablet Take 1-2 tablets by mouth every 6 (six) hours as needed. To be taken after surgery 08/30/20   Cristie Hem, PA-C  traMADol (ULTRAM) 50 MG tablet Take 1-2 tablets (50-100 mg total) by mouth 3 (three) times daily as needed. 08/22/20   Cristie Hem, PA-C     Positive ROS: All other systems have been reviewed and were otherwise negative with the exception of those mentioned in the HPI and as above.  Physical Exam: General: Alert, no acute distress Cardiovascular: No pedal edema Respiratory: No cyanosis, no use of accessory musculature GI: abdomen soft Skin: No lesions in the area of chief complaint Neurologic: Sensation intact distally Psychiatric: Patient is competent for consent with normal mood and affect Lymphatic: no lymphedema  MUSCULOSKELETAL: exam stable  Assessment: left hip degenerative joint disease  Plan: Plan for Procedure(s): LEFT TOTAL HIP ARTHROPLASTY ANTERIOR APPROACH  The risks benefits and alternatives were discussed with  the patient including but not limited to the risks of nonoperative treatment, versus surgical intervention including infection, bleeding, nerve injury,  blood clots, cardiopulmonary complications, morbidity, mortality, among others, and they were willing to proceed.   Preoperative templating of the joint replacement has been completed, documented, and submitted to the Operating Room personnel in order to optimize intra-operative equipment management.   Glee Arvin, MD 09/04/2020 9:07 AM

## 2020-09-04 NOTE — Transfer of Care (Signed)
Immediate Anesthesia Transfer of Care Note  Patient: Shelly Burns  Procedure(s) Performed: LEFT TOTAL HIP ARTHROPLASTY ANTERIOR APPROACH (Left Hip)  Patient Location: PACU  Anesthesia Type:General and Spinal  Level of Consciousness: awake, oriented and patient cooperative  Airway & Oxygen Therapy: Patient Spontanous Breathing and Patient connected to face mask oxygen  Post-op Assessment: Report given to RN and Post -op Vital signs reviewed and stable  Post vital signs: Reviewed  Last Vitals:  Vitals Value Taken Time  BP 115/72 09/04/20 1251  Temp    Pulse 64 09/04/20 1254  Resp 14 09/04/20 1254  SpO2 100 % 09/04/20 1254  Vitals shown include unvalidated device data.  Last Pain:  Vitals:   09/04/20 0902  TempSrc:   PainSc: 0-No pain      Patients Stated Pain Goal: 3 (09/04/20 0902)  Complications: No complications documented.

## 2020-09-05 ENCOUNTER — Encounter (HOSPITAL_COMMUNITY): Payer: Self-pay | Admitting: Orthopaedic Surgery

## 2020-09-05 DIAGNOSIS — M1612 Unilateral primary osteoarthritis, left hip: Secondary | ICD-10-CM | POA: Diagnosis not present

## 2020-09-05 LAB — CBC
HCT: 32 % — ABNORMAL LOW (ref 36.0–46.0)
Hemoglobin: 11.1 g/dL — ABNORMAL LOW (ref 12.0–15.0)
MCH: 30.7 pg (ref 26.0–34.0)
MCHC: 34.7 g/dL (ref 30.0–36.0)
MCV: 88.4 fL (ref 80.0–100.0)
Platelets: 283 10*3/uL (ref 150–400)
RBC: 3.62 MIL/uL — ABNORMAL LOW (ref 3.87–5.11)
RDW: 11.9 % (ref 11.5–15.5)
WBC: 11.1 10*3/uL — ABNORMAL HIGH (ref 4.0–10.5)
nRBC: 0 % (ref 0.0–0.2)

## 2020-09-05 LAB — BASIC METABOLIC PANEL
Anion gap: 10 (ref 5–15)
BUN: 12 mg/dL (ref 6–20)
CO2: 23 mmol/L (ref 22–32)
Calcium: 8.6 mg/dL — ABNORMAL LOW (ref 8.9–10.3)
Chloride: 100 mmol/L (ref 98–111)
Creatinine, Ser: 1.03 mg/dL — ABNORMAL HIGH (ref 0.44–1.00)
GFR, Estimated: >60 mL/min (ref >60)
Glucose, Bld: 180 mg/dL — ABNORMAL HIGH (ref 70–99)
Potassium: 3.4 mmol/L — ABNORMAL LOW (ref 3.5–5.1)
Sodium: 133 mmol/L — ABNORMAL LOW (ref 135–145)

## 2020-09-05 MED ORDER — SODIUM CHLORIDE 0.9 % IV SOLN: INTRAVENOUS | Status: DC

## 2020-09-05 NOTE — Progress Notes (Signed)
Physical Therapy Treatment Patient Details Name: Shelly Burns MRN: [id number] DOB: 09/11/1965 Today's Date: 09/05/2020    History of Present Illness Pt adm for direct anterior lt THR on 09/04/20. PMH - arthritis, HTN    PT Comments    Pt limited by continued pain in Left hip.  We are a session behind as last night's post op Day 1 session did not go as well as anticipated.  She was able to get up OOB, walk around the room and start HEP program.  Her preference and her mobility indicate that one more night would be helpful to get pain better controlled and progress to household distance gait and stairs.  PT will see again this PM.     Follow Up Recommendations  Follow surgeon's recommendation for DC plan and follow-up therapies     Equipment Recommendations  None recommended by PT    Recommendations for Other Services       Precautions / Restrictions Precautions Precautions: None Restrictions Weight Bearing Restrictions: Yes LLE Weight Bearing: Weight bearing as tolerated    Mobility  Bed Mobility Overal bed mobility: Needs Assistance Bed Mobility: Supine to Sit     Supine to sit: Min assist;HOB elevated     General bed mobility comments: Min assist to help progress left leg over the side of the bed.  Bed exercises preformed first to warm up bil legs to prepare for moving.    Transfers Overall transfer level: Needs assistance Equipment used: Rolling walker (2 wheeled) Transfers: Sit to/from [employer] Sit to Stand: Min assist Stand pivot transfers: Min assist       General transfer comment: Min assist to help pt power up to stand, cues for safe hand placement during transitions, and min assist to stabilize for balance during transfer with RW to [facility].  Ambulation/Gait Ambulation/Gait assistance: Min assist Gait Distance (Feet): 20 Feet Assistive device: Rolling walker (2 wheeled) Gait Pattern/deviations: Step-to pattern;Antalgic     General  Gait Details: Pt with moderately antalgic gait pattern.  Cues for LE sequencing and safe RW use.  Reviewed WBAT status.   Stairs             Wheelchair Mobility    Modified Rankin (Stroke Patients Only)       Balance Overall balance assessment: Mild deficits observed, not formally tested                                          Cognition Arousal/Alertness: Awake/alert Behavior During Therapy: WFL for tasks assessed/performed Overall Cognitive Status: Within Functional Limits for tasks assessed                                        Exercises Total Joint Exercises Ankle Circles/Pumps: AROM;Both;10 reps Quad Sets: AROM;Both;10 reps Heel Slides: AROM;Right;AAROM;Left;10 reps Hip ABduction/ADduction: AROM;Right;AAROM;Left;10 reps    General Comments        Pertinent Vitals/Pain Pain Assessment: Faces Faces Pain Scale: Hurts whole lot Pain Location: lt hip Pain Descriptors / Indicators: Grimacing;Guarding Pain Intervention(s): Limited activity within patient's tolerance;Monitored during session;Repositioned;RN gave pain meds during session;Ice applied    Home Living Family/patient expects to be discharged to:: Private residence Living Arrangements: Spouse/significant other;Parent;Children  Prior Function            PT Goals (current goals can now be found in the care plan section) Acute Rehab PT Goals Patient Stated Goal: return home Progress towards PT goals: Progressing toward goals    Frequency    7X/week      PT Plan Current plan remains appropriate    Co-evaluation              AM-PAC PT "6 Clicks" Mobility   Outcome Measure  Help needed turning from your back to your side while in a flat bed without using bedrails?: A Little Help needed moving from lying on your back to sitting on the side of a flat bed without using bedrails?: A Little Help needed moving to and from a bed to a  chair (including a wheelchair)?: A Little Help needed standing up from a chair using your arms (e.g., wheelchair or bedside chair)?: A Little Help needed to walk in hospital room?: A Little Help needed climbing 3-5 steps with a railing? : A Lot 6 Click Score: 17    End of Session   Activity Tolerance: Patient limited by pain Patient left: in chair;with call bell/phone within reach   PT Visit Diagnosis: Other abnormalities of gait and mobility (R26.89);Pain Pain - Right/Left: Left Pain - part of body: Hip     Time: 0093-8182 PT Time Calculation (min) (ACUTE ONLY): 31 min  Charges:  $Gait Training: 8-22 mins $Therapeutic Exercise: 8-22 mins                     Corinna Capra, PT, DPT  Acute Rehabilitation [phone] pager [phone]) [phone] office

## 2020-09-05 NOTE — Progress Notes (Signed)
Subjective: 1 Day Post-Op Procedure(s) (LRB): LEFT TOTAL HIP ARTHROPLASTY ANTERIOR APPROACH (Left) Patient reports pain as mild.    Objective: Vital signs in last 24 hours: Temp:  [97 F (36.1 C)-98.6 F (37 C)] 98.6 F (37 C) (03/22 0723) Pulse Rate:  [64-97] 81 (03/22 0723) Resp:  [10-20] 15 (03/22 0723) BP: (102-130)/(68-96) 105/72 (03/22 0723) SpO2:  [98 %-100 %] 99 % (03/22 0723) Weight:  [92.1 kg] 92.1 kg (03/21 0817)  Intake/Output from previous day: 03/21 0701 - 03/22 0700 In: 1440 [P.O.:240; I.V.:1000; IV Piggyback:200] Out: 750 [Urine:450; Blood:300] Intake/Output this shift: No intake/output data recorded.  Recent Labs    09/05/20 0232  HGB 11.1*   Recent Labs    09/05/20 0232  WBC 11.1*  RBC 3.62*  HCT 32.0*  PLT 283   Recent Labs    09/05/20 0232  NA 133*  K 3.4*  CL 100  CO2 23  BUN 12  CREATININE 1.03*  GLUCOSE 180*  CALCIUM 8.6*   No results for input(s): LABPT, INR in the last 72 hours.  Neurologically intact Neurovascular intact Sensation intact distally Intact pulses distally Dorsiflexion/Plantar flexion intact Incision: scant drainage No cellulitis present Compartment soft   Assessment/Plan: 1 Day Post-Op Procedure(s) (LRB): LEFT TOTAL HIP ARTHROPLASTY ANTERIOR APPROACH (Left) Advance diet Up with therapy D/C IV fluids Discharge home with home health this afternoon vs tomorrow pending progression with PT WBAT LLE ABLA- mild and stable D/c foley AKI- d/c toradol.  Order for continuous fluids placed.  Encourage po fluid intake      Cristie Hem 09/05/2020, 8:14 AM

## 2020-09-05 NOTE — Discharge Summary (Cosign Needed)
Patient ID: Shelly Burns MRN: [id number] DOB/AGE: December 31, 1965 55 y.o.  Admit date: 09/04/2020 Discharge date: 09/06/2020  Admission Diagnoses:  Principal Problem:   Primary osteoarthritis of left hip Active Problems:   Status post total replacement of left hip   Discharge Diagnoses:  Same  Past Medical History:  Diagnosis Date  . Arthritis    left hip  . Hypertension   . Pneumonia     Surgeries: Procedure(s): LEFT TOTAL HIP ARTHROPLASTY ANTERIOR APPROACH on 09/04/2020   Consultants:   Discharged Condition: Improved  Hospital Course: Shelly Burns is an 55 y.o. female who was admitted 09/04/2020 for operative treatment ofPrimary osteoarthritis of left hip. Patient has severe unremitting pain that affects sleep, daily activities, and work/hobbies. After pre-op clearance the patient was taken to the operating room on 09/04/2020 and underwent  Procedure(s): LEFT TOTAL HIP ARTHROPLASTY ANTERIOR APPROACH.    Patient was given perioperative antibiotics:  Anti-infectives (From admission, onward)   Start     Dose/Rate Route Frequency Ordered Stop   09/04/20 1800  ceFAZolin (ANCEF) IVPB 2g/100 mL premix        2 g 200 mL/hr over 30 Minutes Intravenous Every 6 hours 09/04/20 1248 09/05/20 0559   09/04/20 1109  vancomycin (VANCOCIN) powder  Status:  Discontinued          As needed 09/04/20 1109 09/04/20 1248   09/04/20 0900  ceFAZolin (ANCEF) IVPB 2g/100 mL premix        2 g 200 mL/hr over 30 Minutes Intravenous On call to O.R. 09/04/20 0847 09/04/20 1049       Patient was given sequential compression devices, early ambulation, and chemoprophylaxis to prevent DVT.  Patient benefited maximally from hospital stay and there were no complications.    Recent vital signs:  Patient Vitals for the past 24 hrs:  BP Temp Temp src Pulse Resp SpO2  09/06/20 0740 (!) 141/90 98.6 F (37 C) Oral 92 20 100 %  09/06/20 0426 114/85 98.1 F (36.7 C) Oral 79 16 96 %  09/05/20 2006  119/85 98.5 F (36.9 C) Oral 87 16 98 %  09/05/20 1510 (!) 128/94 98 F (36.7 C) Oral 80 17 100 %     Recent laboratory studies:  Recent Labs    09/05/20 0232  WBC 11.1*  HGB 11.1*  HCT 32.0*  PLT 283  NA 133*  K 3.4*  CL 100  CO2 23  BUN 12  CREATININE 1.03*  GLUCOSE 180*  CALCIUM 8.6*     Discharge Medications:   Allergies as of 09/06/2020   No Known Allergies     Medication List    STOP taking these medications   traMADol 50 MG tablet Commonly known as: ULTRAM     TAKE these medications   acetaminophen 500 MG tablet Commonly known as: TYLENOL Take 500 mg by mouth every 6 (six) hours as needed.   aspirin EC 81 MG tablet Take 1 tablet (81 mg total) by mouth 2 (two) times daily. To be taken after surgery   docusate sodium 100 MG capsule Commonly known as: Colace Take 1 capsule (100 mg total) by mouth daily as needed.   lisinopril-hydrochlorothiazide 20-25 MG tablet Commonly known as: ZESTORETIC Take 1 tablet by mouth daily.   methocarbamol 500 MG tablet Commonly known as: Robaxin Take 1 tablet (500 mg total) by mouth 2 (two) times daily as needed. To be taken after surgery   ondansetron 4 MG tablet Commonly known as: Zofran Take 1 tablet (4 mg  total) by mouth every 8 (eight) hours as needed for nausea or vomiting.   oxyCODONE-acetaminophen 5-325 MG tablet Commonly known as: Percocet Take 1-2 tablets by mouth every 6 (six) hours as needed. To be taken after surgery            Durable Medical Equipment  (From admission, onward)         Start     Ordered   09/04/20 1710  DME Walker rolling  Once       Question:  Patient needs a walker to treat with the following condition  Answer:  History of hip replacement   09/04/20 1709   09/04/20 1710  DME 3 n 1  Once        09/04/20 1709   09/04/20 1710  DME Bedside commode  Once       Question:  Patient needs a bedside commode to treat with the following condition  Answer:  History of hip  replacement   09/04/20 1709          Diagnostic Studies: DG Chest 2 View  Result Date: 08/31/2020 CLINICAL DATA:  55 year old female under preoperative evaluation prior to left hip replacement. EXAM: CHEST - 2 VIEW COMPARISON:  No priors. FINDINGS: Lung volumes are normal. No consolidative airspace disease. No pleural effusions. No pneumothorax. No pulmonary nodule or mass noted. Pulmonary vasculature and the cardiomediastinal silhouette are within normal limits. IMPRESSION: No radiographic evidence of acute cardiopulmonary disease. Electronically Signed   By: Trudie Reed M.D.   On: 08/31/2020 09:16   DG Pelvis Portable  Result Date: 09/04/2020 CLINICAL DATA:  Status post total hip replacement EXAM: PORTABLE PELVIS 1-2 VIEWS COMPARISON:  September 05, 2019 study obtained earlier in the day FINDINGS: There is a total hip replacement on the left with prosthetic components on the left appearing well-seated on frontal view. No fracture or dislocation. There is moderate narrowing of the right hip joint. No erosive change. Soft tissue air noted in the left proximal femur region. IMPRESSION: Total hip replacement on the left with prosthetic components well-seated on frontal view. Acute postoperative changes noted on the left. Moderate narrowing right hip joint. No fracture or dislocation. Electronically Signed   By: Bretta Bang III M.D.   On: 09/04/2020 14:46   DG C-Arm 1-60 Min  Result Date: 09/04/2020 CLINICAL DATA:  Provided history: Intraoperative. Additional provided: Left anterior hip replacement. Provided fluoroscopy time 32 seconds. EXAM: DG C-ARM 1-60 MIN; OPERATIVE LEFT HIP WITH PELVIS COMPARISON:  Radiographs of the left hip with pelvis 06/29/2020. FINDINGS: Five intraoperative fluoroscopic images are provided. There are no laterality markers on the provided images. The images are presumably of the left hip given the provided history. The images demonstrate interval left total hip  arthroplasty. The femoral and acetabular components appear well seated. No unexpected finding. IMPRESSION: Five intraoperative fluoroscopic images from left total hip arthroplasty, as described. Electronically Signed   By: Jackey Loge DO   On: 09/04/2020 13:05   DG HIP OPERATIVE UNILAT W OR W/O PELVIS LEFT  Result Date: 09/04/2020 CLINICAL DATA:  Provided history: Intraoperative. Additional provided: Left anterior hip replacement. Provided fluoroscopy time 32 seconds. EXAM: DG C-ARM 1-60 MIN; OPERATIVE LEFT HIP WITH PELVIS COMPARISON:  Radiographs of the left hip with pelvis 06/29/2020. FINDINGS: Five intraoperative fluoroscopic images are provided. There are no laterality markers on the provided images. The images are presumably of the left hip given the provided history. The images demonstrate interval left total hip arthroplasty. The femoral  and acetabular components appear well seated. No unexpected finding. IMPRESSION: Five intraoperative fluoroscopic images from left total hip arthroplasty, as described. Electronically Signed   By: Jackey Loge DO   On: 09/04/2020 13:05   DG HIP OPERATIVE UNILAT W OR W/O PELVIS LEFT  Result Date: 09/04/2020 CLINICAL DATA:  Incorrect instrument count. EXAM: OPERATIVE LEFT HIP 1 VIEW COMPARISON:  Intraoperative left hip x-rays from same day. FINDINGS: Single frontal view of the left hip demonstrates interval left total hip arthroplasty. Components are well aligned. No acute osseous abnormality. Postsurgical changes and subcutaneous emphysema. No radiopaque foreign body identified. IMPRESSION: 1. No radiopaque foreign body. 2. Postoperative changes from left total hip arthroplasty. These results were called by telephone at the time of interpretation on 09/04/2020 at 12:22 pm to [facility], who verbally acknowledged these results. Electronically Signed   By: Obie Dredge M.D.   On: 09/04/2020 12:23    Disposition: Discharge disposition: 01-Home or Self  Care          Follow-up Information    Tarry Kos, MD In 2 weeks.   Specialty: Orthopedic Surgery Why: For suture removal, For wound re-check Contact information: [address] [place] [address] [phone]        [facility] Follow up.   Specialty: Home Health Services Why: home health services arranged with [facility], start of care within 48 hours post discharge Contact information: [address] [place] [address] [phone]                Signed: Cristie Hem 09/06/2020, 7:56 AM

## 2020-09-05 NOTE — Progress Notes (Signed)
Physical Therapy Treatment Patient Details Name: Shelly Burns MRN: [id number] DOB: 01/04/1966 Today's Date: 09/05/2020    History of Present Illness Pt adm for direct anterior lt THR on 09/04/20. PMH - arthritis, HTN    PT Comments    Pt was able to walk the hallway this PM, start stair training and complete review of her in bed and seated exercises.  Plan tomorrow AM prior to d/c is to review stairs, review standing exercises, and practice tub transfer (she is trying to decide if she wants a seat to sit on in the shower-3-in-1).  PT will continue to follow acutely for safe mobility progression.   Follow Up Recommendations  Follow surgeon's recommendation for DC plan and follow-up therapies     Equipment Recommendations  None recommended by PT    Recommendations for Other Services       Precautions / Restrictions Restrictions LLE Weight Bearing: Weight bearing as tolerated    Mobility  Bed Mobility Overal bed mobility: Needs Assistance Bed Mobility: Supine to Sit;Sit to Supine     Supine to sit: Min assist Sit to supine: Min assist   General bed mobility comments: Min assist to help progress left leg into and out of bed.    Transfers Overall transfer level: Needs assistance Equipment used: Rolling walker (2 wheeled) Transfers: Sit to/from Stand Sit to Stand: Supervision         General transfer comment: supervision for safety  Ambulation/Gait Ambulation/Gait assistance: Min guard Gait Distance (Feet): 200 Feet Assistive device: Rolling walker (2 wheeled) Gait Pattern/deviations: Step-through pattern;Antalgic     General Gait Details: mildly antalgic step through gait pattern this PM, rolling walker smoothly forward.  Discussed when she switches to cane to hold it in her right hand.   Stairs Stairs: Yes Stairs assistance: Supervision Stair Management: Two rails;Step to pattern;Forwards Number of Stairs: 2 General stair comments: educated and provided  handout re: up with non surgical leg and down with surgical leg.   Wheelchair Mobility    Modified Rankin (Stroke Patients Only)       Balance Overall balance assessment: Mild deficits observed, not formally tested                                          Cognition Arousal/Alertness: Awake/alert Behavior During Therapy: WFL for tasks assessed/performed Overall Cognitive Status: Within Functional Limits for tasks assessed                                        Exercises Total Joint Exercises Ankle Circles/Pumps: AROM;Both;10 reps Quad Sets: AROM;Both;10 reps Short Arc Quad: AROM;Left;10 reps Heel Slides: AROM;Right;AAROM;Left;10 reps Hip ABduction/ADduction: AROM;Right;AAROM;Left;10 reps Long Arc Quad: AROM;Left;10 reps Other Exercises Other Exercises: HEP handout given    General Comments        Pertinent Vitals/Pain Pain Assessment: Faces Faces Pain Scale: Hurts little more Pain Location: lt hip Pain Descriptors / Indicators: Grimacing;Guarding Pain Intervention(s): Limited activity within patient's tolerance;Monitored during session;Repositioned;Ice applied (offered pre medication, but stated she did not need it)    Home Living                      Prior Function            PT Goals (current goals can now  be found in the care plan section) Acute Rehab PT Goals Patient Stated Goal: return home Progress towards PT goals: Progressing toward goals    Frequency    7X/week      PT Plan Current plan remains appropriate    Co-evaluation              AM-PAC PT "6 Clicks" Mobility   Outcome Measure  Help needed turning from your back to your side while in a flat bed without using bedrails?: A Little Help needed moving from lying on your back to sitting on the side of a flat bed without using bedrails?: A Little Help needed moving to and from a bed to a chair (including a wheelchair)?: A Little Help needed  standing up from a chair using your arms (e.g., wheelchair or bedside chair)?: A Little Help needed to walk in hospital room?: A Little Help needed climbing 3-5 steps with a railing? : A Little 6 Click Score: 18    End of Session   Activity Tolerance: Patient limited by pain Patient left: in bed;with call bell/phone within reach   PT Visit Diagnosis: Other abnormalities of gait and mobility (R26.89);Pain Pain - Right/Left: Left Pain - part of body: Hip     Time: 4562-5638 PT Time Calculation (min) (ACUTE ONLY): 32 min  Charges:  $Gait Training: 8-22 mins $Therapeutic Exercise: 8-22 mins                     Corinna Capra, PT, DPT  Acute Rehabilitation [phone] pager [phone]) [phone] office

## 2020-09-05 NOTE — TOC Initial Note (Signed)
Transition of Care [facility]) - Initial/Assessment Note    Patient Details  Name: Shelly Burns MRN: [id number] Date of Birth: August 14, 1965  [facility]) CM/SW Contact:    Epifanio Lesches, RN Phone Number: 09/05/2020, 12:47 PM  Clinical Narrative:                 Pt  admitted with L hip degenerative joint. From home along  with 54 y/o daughter and mom. Pt states PTA independent with ADL's. Already has rolling walker.      - S/p Total hip replacement; Left hip, 3/21  Prior to admit with pre-op screening pt was setup with [facility] agency to provide HHPT. NCM made [facility] aware of potential d/c plan for today pending 2nd therapy session with PT and pain management.  Pt states has transportation to home once d/c. Pt without Rx med concerns or affordability issues.  Post hospital f/u appointment noted on AVS.  No present needs identified per NCM.  Expected Discharge Plan: Home w Home Health Services Barriers to Discharge: No Barriers Identified   Patient Goals and CMS Choice     Choice offered to / list presented to : Patient  Expected Discharge Plan and Services Expected Discharge Plan: Home w Home Health Services   Discharge Planning Services: CM Consult   Living arrangements for the past 2 months: Single Family Home Expected Discharge Date: 09/05/20                         HH Arranged: PT HH Agency: [facility] Date HH Agency Contacted: 09/05/20 Time HH Agency Contacted: [phone]    Prior Living Arrangements/Services Living arrangements for the past 2 months: Single Family Home Lives with:: Adult Children Patient language and need for interpreter reviewed:: Yes Do you feel safe going back to the place where you live?: Yes          Current home services: DME (rolling walker) Criminal Activity/Legal Involvement Pertinent to Current Situation/Hospitalization: No - Comment as needed  Activities of Daily  Living Home Assistive Devices/Equipment: None ADL Screening (condition at time of admission) Patient's cognitive ability adequate to safely complete daily activities?: Yes Is the patient deaf or have difficulty hearing?: No Does the patient have difficulty seeing, even when wearing glasses/contacts?: No Does the patient have difficulty concentrating, remembering, or making decisions?: No Patient able to express need for assistance with ADLs?: Yes Independently performs ADLs?: Yes (appropriate for developmental age) Does the patient have difficulty walking or climbing stairs?: Yes Weakness of Legs: Left Weakness of Arms/Hands: None  Permission Sought/Granted   Permission granted to share information with : Yes, Verbal Permission Granted              Emotional Assessment Appearance:: Appears stated age Attitude/Demeanor/Rapport: Gracious Affect (typically observed): Accepting Orientation: : Oriented to Self,Oriented to Place,Oriented to  Time,Oriented to Situation Alcohol / Substance Use: Not Applicable Psych Involvement: No (comment)  Admission diagnosis:  Status post total replacement of left hip [Z96.642] Patient Active Problem List   Diagnosis Date Noted  . Status post total replacement of left hip 09/04/2020  . Primary osteoarthritis of left hip 09/03/2020   PCP:  Pcp, No Pharmacy:   [id number] - [place], [place] - [address] AT [address] [place] [address] Phone: [phone] Fax: [phone]     Social Determinants of Health (SDOH) Interventions  Readmission Risk Interventions No flowsheet data found.

## 2020-09-06 ENCOUNTER — Encounter (HOSPITAL_COMMUNITY): Payer: Self-pay | Admitting: Orthopaedic Surgery

## 2020-09-06 DIAGNOSIS — M1612 Unilateral primary osteoarthritis, left hip: Secondary | ICD-10-CM | POA: Diagnosis not present

## 2020-09-06 MED ORDER — BUPIVACAINE IN DEXTROSE 0.75-8.25 % IT SOLN
INTRATHECAL | Status: DC | PRN
  Administered 2020-09-04: 2 mL via INTRATHECAL

## 2020-09-06 NOTE — Anesthesia Procedure Notes (Signed)
Spinal  Patient location during procedure: OR Start time: 09/04/2020 10:30 AM End time: 09/04/2020 10:35 AM Reason for block: surgical anesthesia Staffing Performed: anesthesiologist  Anesthesiologist: Val Eagle, MD Preanesthetic Checklist Completed: patient identified, IV checked, risks and benefits discussed, surgical consent, monitors and equipment checked, pre-op evaluation and timeout performed Spinal Block Patient position: sitting Prep: DuraPrep Patient monitoring: heart rate, cardiac monitor, continuous pulse ox and blood pressure Approach: midline Location: L4-5 Injection technique: single-shot Needle Needle type: Pencan  Needle gauge: 24 G Needle length: 9 cm Assessment Sensory level: L1 Events: CSF return Additional Notes Positive swirl on aspiration prior to local injection. Continued free flow on aspiration throughout injection. Level was not adequate for surgical analgesia and decision to convert to general anesthesia was made.

## 2020-09-06 NOTE — Progress Notes (Signed)
Physical Therapy Treatment Patient Details Name: Shelly Burns MRN: [id number] DOB: [birth year] Today's Date: 09/06/2020    History of Present Illness Pt adm for direct anterior lt THR on 09/04/20. PMH - arthritis, HTN    PT Comments    Pt moving very well. Walked to the gym this AM and practiced stairs and tub transfer.  She is thinking that she would like a 3-in-1 primarily for use in the shower.  PT will reach out to RN case manager.  We finished reviewing standing hip exercises.  PT will continue to follow acutely for safe mobility progression.   Follow Up Recommendations  Follow surgeon's recommendation for DC plan and follow-up therapies     Equipment Recommendations  3in1 (PT)    Recommendations for Other Services       Precautions / Restrictions Precautions Precautions: None Restrictions Weight Bearing Restrictions: Yes LLE Weight Bearing: Weight bearing as tolerated    Mobility  Bed Mobility               General bed mobility comments: Pt was OOB in the recliner chair.    Transfers Overall transfer level: Needs assistance Equipment used: Rolling walker (2 wheeled) Transfers: Sit to/from Stand Sit to Stand: Modified independent (Device/Increase time)            Ambulation/Gait Ambulation/Gait assistance: Supervision Gait Distance (Feet): 200 Feet Assistive device: Rolling walker (2 wheeled) Gait Pattern/deviations: Step-through pattern;Antalgic     General Gait Details: very mildly antalgic gait pattern, pt trying to be as light with her hands as possible.   Stairs Stairs: Yes Stairs assistance: Supervision Stair Management: Two rails;Step to pattern;Forwards Number of Stairs: 5 General stair comments: Pt able to return demonstration from yesterday without cues from PT for correct LE sequencing.   Wheelchair Mobility    Modified Rankin (Stroke Patients Only)       Balance Overall balance assessment: Mild deficits observed, not  formally tested                                          Cognition Arousal/Alertness: Awake/alert Behavior During Therapy: WFL for tasks assessed/performed Overall Cognitive Status: Within Functional Limits for tasks assessed                                        Exercises Total Joint Exercises Hip ABduction/ADduction: AROM;Left;10 reps;Standing Knee Flexion: AROM;Left;10 reps;Standing Marching in Standing: AROM;Left;10 reps;Standing Standing Hip Extension: AROM;Left;10 reps;Standing    General Comments General comments (skin integrity, edema, etc.): Practiced tub transfer step over with min guard assist simulating tub at home with 3-in-1 BSC.  Pt advised to make sure someone was with her for her first try at home.      Pertinent Vitals/Pain Pain Assessment: Faces Faces Pain Scale: Hurts little more Pain Location: lt hip Pain Descriptors / Indicators: Grimacing;Guarding Pain Intervention(s): Limited activity within patient's tolerance;Monitored during session;Repositioned    Home Living                      Prior Function            PT Goals (current goals can now be found in the care plan section) Acute Rehab PT Goals Patient Stated Goal: return home Progress towards PT goals: Progressing toward goals  Frequency    7X/week      PT Plan Current plan remains appropriate    Co-evaluation              AM-PAC PT "6 Clicks" Mobility   Outcome Measure  Help needed turning from your back to your side while in a flat bed without using bedrails?: A Little Help needed moving from lying on your back to sitting on the side of a flat bed without using bedrails?: A Little Help needed moving to and from a bed to a chair (including a wheelchair)?: A Little Help needed standing up from a chair using your arms (e.g., wheelchair or bedside chair)?: A Little Help needed to walk in hospital room?: A Little Help needed climbing  3-5 steps with a railing? : A Little 6 Click Score: 18    End of Session   Activity Tolerance: Patient tolerated treatment well Patient left: in chair;with call bell/phone within reach Nurse Communication: Mobility status;Other (comment) (done with therapy needs 3-in-1) PT Visit Diagnosis: Other abnormalities of gait and mobility (R26.89);Pain Pain - Right/Left: Left Pain - part of body: Hip     Time: 6045-4098 PT Time Calculation (min) (ACUTE ONLY): 18 min  Charges:  $Gait Training: 8-22 mins                     Corinna Capra, PT, DPT  Acute Rehabilitation [phone] pager [phone]) [phone] office

## 2020-09-06 NOTE — Progress Notes (Signed)
Discharge summary packet provided to pt with instructions. Pt verbalized understanding of instructions. No complaints. 3 in 1 provided as ordered. Pt d/t of home with HH as ordered. Pt remains alert/oriented in no apparent distress. Her daughter would be responsible for her ride back home

## 2020-09-06 NOTE — Progress Notes (Signed)
Subjective: 2 Days Post-Op Procedure(s) (LRB): LEFT TOTAL HIP ARTHROPLASTY ANTERIOR APPROACH (Left) Patient reports pain as mild.  Feeling well and ready to go home after first PT session  Objective: Vital signs in last 24 hours: Temp:  [98 F (36.7 C)-98.6 F (37 C)] 98.6 F (37 C) (03/23 0740) Pulse Rate:  [79-92] 92 (03/23 0740) Resp:  [16-20] 20 (03/23 0740) BP: (114-141)/(85-94) 141/90 (03/23 0740) SpO2:  [96 %-100 %] 100 % (03/23 0740)  Intake/Output from previous day: 03/22 0701 - 03/23 0700 In: 815.1 [P.O.:360; I.V.:455.1] Out: -  Intake/Output this shift: No intake/output data recorded.  Recent Labs    09/05/20 0232  HGB 11.1*   Recent Labs    09/05/20 0232  WBC 11.1*  RBC 3.62*  HCT 32.0*  PLT 283   Recent Labs    09/05/20 0232  NA 133*  K 3.4*  CL 100  CO2 23  BUN 12  CREATININE 1.03*  GLUCOSE 180*  CALCIUM 8.6*   No results for input(s): LABPT, INR in the last 72 hours.  Neurologically intact Neurovascular intact Sensation intact distally Intact pulses distally Dorsiflexion/Plantar flexion intact Incision: scant drainage No cellulitis present Compartment soft   Assessment/Plan: 2 Days Post-Op Procedure(s) (LRB): LEFT TOTAL HIP ARTHROPLASTY ANTERIOR APPROACH (Left) Advance diet Up with therapy Discharge home with home health after first PT session WBAT LLE ABLA- mild and stable       Cristie Hem 09/06/2020, 7:54 AM

## 2020-09-06 NOTE — Anesthesia Postprocedure Evaluation (Signed)
Anesthesia Post Note  Patient: Shelly Burns  Procedure(s) Performed: LEFT TOTAL HIP ARTHROPLASTY ANTERIOR APPROACH (Left Hip)     Patient location during evaluation: PACU Anesthesia Type: General Level of consciousness: awake and alert Pain management: pain level controlled Vital Signs Assessment: post-procedure vital signs reviewed and stable Respiratory status: spontaneous breathing, nonlabored ventilation, respiratory function stable and patient connected to nasal cannula oxygen Cardiovascular status: blood pressure returned to baseline and stable Postop Assessment: no apparent nausea or vomiting Anesthetic complications: no   No complications documented.  Last Vitals:  Vitals:   09/06/20 0426 09/06/20 0740  BP: 114/85 (!) 141/90  Pulse: 79 92  Resp: 16 20  Temp: 36.7 C 37 C  SpO2: 96% 100%    Last Pain:  Vitals:   09/06/20 0800  TempSrc:   PainSc: 0-No pain                 Veria Stradley

## 2020-09-12 ENCOUNTER — Telehealth: Payer: Self-pay

## 2020-09-12 NOTE — Telephone Encounter (Signed)
Hard to tell if she is getting a uti or maybe a yeast infection.  Can she reach out to her pcp to see if they can see her in for a urinalysis asap?

## 2020-09-12 NOTE — Telephone Encounter (Signed)
Please advise 

## 2020-09-12 NOTE — Telephone Encounter (Signed)
Patient called she stated since she had a catheter placed in her she has been having a odor/ discharge coming from her vagina she is requesting a antibiotic or cream to be sent to her pharmacy call back [phone]

## 2020-09-12 NOTE — Telephone Encounter (Signed)
I called, no answer.  ?

## 2020-09-13 ENCOUNTER — Other Ambulatory Visit: Payer: Self-pay

## 2020-09-13 DIAGNOSIS — N898 Other specified noninflammatory disorders of vagina: Secondary | ICD-10-CM

## 2020-09-13 NOTE — Telephone Encounter (Signed)
Called patient. Advised on message below. She has no PCP and would like to be referred to one. Order placed-urgent referral.

## 2020-09-19 ENCOUNTER — Other Ambulatory Visit: Payer: Self-pay

## 2020-09-19 ENCOUNTER — Ambulatory Visit (INDEPENDENT_AMBULATORY_CARE_PROVIDER_SITE_OTHER): Payer: BC Managed Care – PPO | Admitting: Physician Assistant

## 2020-09-19 DIAGNOSIS — Z96642 Presence of left artificial hip joint: Secondary | ICD-10-CM

## 2020-09-19 MED ORDER — FLUCONAZOLE 150 MG PO TABS: 150.0000 mg | ORAL_TABLET | Freq: Every day | ORAL | 0 refills | Status: DC

## 2020-09-19 NOTE — Progress Notes (Signed)
   Post-Op Visit Note   Patient: Shelly Burns           Date of Birth: 1965/08/23           MRN: [id number] Visit Date: 09/19/2020 PCP: Pcp, No   Assessment & Plan:  Chief Complaint:  Chief Complaint  Patient presents with  . Left Hip - Follow-up   Visit Diagnoses:  1. Status post total replacement of left hip     Plan: Patient is a very pleasant 55 year old female who comes in today 2 weeks out left total hip replacement.  She has been doing well.  She has minimal pain which is relieved with over-the-counter pain medication.  She has recently finished home health physical therapy where she is ambulating without assistance.  Examination of her left hip reveals a well-healing incision without complication.  She is neurovascular intact distally.  At this point, stitches were removed and Steri-Strips applied.  She will continue with her home exercise program.  Dental prophylaxis reinforced.  Follow-up with [place] in 4 weeks time for repeat evaluation and AP pelvis x-rays.  Call with concerns or questions.  Follow-Up Instructions: Return in about 4 weeks (around 10/17/2020).   Orders:  No orders of the defined types were placed in this encounter.  Meds ordered this encounter  Medications  . fluconazole (DIFLUCAN) 150 MG tablet    Sig: Take 1 tablet (150 mg total) by mouth daily.    Dispense:  1 tablet    Refill:  0    Imaging: No new imaging  PMFS History: Patient Active Problem List   Diagnosis Date Noted  . Status post total replacement of left hip 09/04/2020  . Primary osteoarthritis of left hip 09/03/2020   Past Medical History:  Diagnosis Date  . Arthritis    left hip  . Hypertension   . Pneumonia     No family history on file.  Past Surgical History:  Procedure Laterality Date  . ABDOMINAL HYSTERECTOMY     partial hysterectomy 2017  . TOTAL HIP ARTHROPLASTY Left 09/04/2020  . TOTAL HIP ARTHROPLASTY Left 09/04/2020   Procedure: LEFT TOTAL HIP ARTHROPLASTY  ANTERIOR APPROACH;  Surgeon: Tarry Kos, MD;  Location: [facility] OR;  Service: Orthopedics;  Laterality: Left;  . TUBAL LIGATION     Social History   Occupational History  . Not on file  Tobacco Use  . Smoking status: Never Smoker  . Smokeless tobacco: Never Used  Vaping Use  . Vaping Use: Never used  Substance and Sexual Activity  . Alcohol use: Never  . Drug use: Never  . Sexual activity: Not on file

## 2020-10-10 ENCOUNTER — Other Ambulatory Visit: Payer: Self-pay | Admitting: Physician Assistant

## 2020-10-17 ENCOUNTER — Other Ambulatory Visit: Payer: Self-pay

## 2020-10-17 ENCOUNTER — Ambulatory Visit (INDEPENDENT_AMBULATORY_CARE_PROVIDER_SITE_OTHER): Payer: BC Managed Care – PPO | Admitting: Orthopaedic Surgery

## 2020-10-17 ENCOUNTER — Ambulatory Visit (INDEPENDENT_AMBULATORY_CARE_PROVIDER_SITE_OTHER): Payer: BC Managed Care – PPO

## 2020-10-17 ENCOUNTER — Encounter: Payer: Self-pay | Admitting: Orthopaedic Surgery

## 2020-10-17 DIAGNOSIS — Z96642 Presence of left artificial hip joint: Secondary | ICD-10-CM

## 2020-10-17 NOTE — Progress Notes (Signed)
   Post-Op Visit Note   Patient: Shelly Burns           Date of Birth: 05-08-1966           MRN: [id number] Visit Date: 10/17/2020 PCP: Pcp, No   Assessment & Plan:  Chief Complaint:  Chief Complaint  Patient presents with  . Left Hip - Pain   Visit Diagnoses:  1. Status post total replacement of left hip     Plan:   Shelly Burns is 6-week status post left total hip replacement.  Endorses some mild discomfort with increased activity.  Mainly done is walking for now.  Shelly Burns is very pleased overall.  Has some tingling around the incision.  Left hip shows fully healed surgical scar.  Mild scar tenderness to palpation.  No signs of infection.  Ambulates with a mild limp especially at start up.  Range of motion does not produce a significant pain.  X-rays are unremarkable.  Shelly Burns will continue to do her exercises and walk for recuperation.  Shelly Burns may discontinue aspirin at this point.  Dental prophylaxis was reinforced.  We will see her back in 6 weeks for 64-month visit.  Follow-Up Instructions: Return in about 6 weeks (around 11/28/2020).   Orders:  Orders Placed This Encounter  Procedures  . XR Pelvis 1-2 Views   No orders of the defined types were placed in this encounter.   Imaging: XR Pelvis 1-2 Views  Result Date: 10/17/2020 Stable total hip replacement without complications.  Mild to moderate degenerative changes to the right hip.   PMFS History: Patient Active Problem List   Diagnosis Date Noted  . Status post total replacement of left hip 09/04/2020  . Primary osteoarthritis of left hip 09/03/2020   Past Medical History:  Diagnosis Date  . Arthritis    left hip  . Hypertension   . Pneumonia     History reviewed. No pertinent family history.  Past Surgical History:  Procedure Laterality Date  . ABDOMINAL HYSTERECTOMY     partial hysterectomy 2017  . TOTAL HIP ARTHROPLASTY Left 09/04/2020  . TOTAL HIP ARTHROPLASTY Left 09/04/2020   Procedure: LEFT TOTAL HIP  ARTHROPLASTY ANTERIOR APPROACH;  Surgeon: Tarry Kos, MD;  Location: [facility] OR;  Service: Orthopedics;  Laterality: Left;  . TUBAL LIGATION     Social History   Occupational History  . Not on file  Tobacco Use  . Smoking status: Never Smoker  . Smokeless tobacco: Never Used  Vaping Use  . Vaping Use: Never used  Substance and Sexual Activity  . Alcohol use: Never  . Drug use: Never  . Sexual activity: Not on file

## 2020-11-28 ENCOUNTER — Ambulatory Visit (INDEPENDENT_AMBULATORY_CARE_PROVIDER_SITE_OTHER): Payer: BC Managed Care – PPO | Admitting: Physician Assistant

## 2020-11-28 ENCOUNTER — Encounter: Payer: Self-pay | Admitting: Orthopaedic Surgery

## 2020-11-28 VITALS — Ht 68.5 in | Wt 203.0 lb

## 2020-11-28 DIAGNOSIS — Z96642 Presence of left artificial hip joint: Secondary | ICD-10-CM

## 2020-11-28 NOTE — Progress Notes (Signed)
   Post-Op Visit Note   Patient: Shelly Burns           Date of Birth: [birth year]           MRN: [id number] Visit Date: 11/28/2020 PCP: Pcp, No   Assessment & Plan:  Chief Complaint:  Chief Complaint  Patient presents with   Left Hip - Follow-up    Left total hip arthroplasty 09/04/2020   Visit Diagnoses:  1. History of total hip replacement, left     Plan: Patient is a pleasant 55 year old female who comes in today 3 months out left total hip replacement date of surgery 09/04/2020.  She has been doing very well.  She has no complaints.  Left hip exam shows full range of motion and painless logroll.  She is neurovascular intact distally.  At this point, she will continue to advance with activity as tolerated.  Dental prophylaxis reinforced.  We have provided her with a work note to return full duty without restrictions 11/30/2020.  Follow-up with [place] in 3 months time for repeat evaluation and AP pelvis x-rays.  Call with concerns or questions in meantime.  Follow-Up Instructions: Return in about 3 months (around 02/28/2021).   Orders:  No orders of the defined types were placed in this encounter.  No orders of the defined types were placed in this encounter.   Imaging: No new imaging  PMFS History: Patient Active Problem List   Diagnosis Date Noted   Status post total replacement of left hip 09/04/2020   Primary osteoarthritis of left hip 09/03/2020   Past Medical History:  Diagnosis Date   Arthritis    left hip   Hypertension    Pneumonia     History reviewed. No pertinent family history.  Past Surgical History:  Procedure Laterality Date   ABDOMINAL HYSTERECTOMY     partial hysterectomy 2017   TOTAL HIP ARTHROPLASTY Left 09/04/2020   TOTAL HIP ARTHROPLASTY Left 09/04/2020   Procedure: LEFT TOTAL HIP ARTHROPLASTY ANTERIOR APPROACH;  Surgeon: Tarry Kos, MD;  Location: [facility] OR;  Service: Orthopedics;  Laterality: Left;   TUBAL LIGATION     Social History    Occupational History   Not on file  Tobacco Use   Smoking status: Never   Smokeless tobacco: Never  Vaping Use   Vaping Use: Never used  Substance and Sexual Activity   Alcohol use: Never   Drug use: Never   Sexual activity: Not on file

## 2020-12-20 ENCOUNTER — Telehealth: Payer: Self-pay

## 2020-12-20 NOTE — Telephone Encounter (Signed)
I have called pt to move her new pt visit to 2pm/ no answer. Left a message to call back.

## 2021-01-05 ENCOUNTER — Ambulatory Visit: Payer: BC Managed Care – PPO | Admitting: Family

## 2021-02-28 ENCOUNTER — Ambulatory Visit (INDEPENDENT_AMBULATORY_CARE_PROVIDER_SITE_OTHER): Payer: BC Managed Care – PPO | Admitting: Orthopaedic Surgery

## 2021-02-28 ENCOUNTER — Ambulatory Visit: Payer: Self-pay

## 2021-02-28 ENCOUNTER — Other Ambulatory Visit: Payer: Self-pay

## 2021-02-28 DIAGNOSIS — Z96642 Presence of left artificial hip joint: Secondary | ICD-10-CM | POA: Diagnosis not present

## 2021-02-28 NOTE — Progress Notes (Signed)
   Post-Op Visit Note   Patient: Shelly Burns           Date of Birth: 1966/02/21           MRN: [id number] Visit Date: 02/28/2021 PCP: Pcp, No   Assessment & Plan:  Chief Complaint:  Chief Complaint  Patient presents with   Left Hip - Pain, Follow-up   Visit Diagnoses:  1. History of total hip replacement, left     Plan: [place] is a nearly 43-month status post left total hip replacement.  Overall doing well reports no pain.  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  Has noticed some swelling towards the end of the day which resolves with elevation and compression socks.  No complaints otherwise.  Left hip scar is fully healed.  Normal ambulation and range of motion without pain.  Her x-rays demonstrate a stable left total hip replacement without any complications.  I am very happy with her progress in recovery and it sounds like she is happy and doing well also.  Dental prophylaxis reinforced.  Recheck in 6 months for her 1 year visit with standing AP pelvis x-rays.  Follow-Up Instructions: Return in about 6 months (around 08/28/2021).   Orders:  Orders Placed This Encounter  Procedures   XR Pelvis 1-2 Views   No orders of the defined types were placed in this encounter.   Imaging: XR Pelvis 1-2 Views  Result Date: 02/28/2021 Stable total hip replacement without complications   PMFS History: Patient Active Problem List   Diagnosis Date Noted   Status post total replacement of left hip 09/04/2020   Primary osteoarthritis of left hip 09/03/2020   Past Medical History:  Diagnosis Date   Arthritis    left hip   Hypertension    Pneumonia     No family history on file.  Past Surgical History:  Procedure Laterality Date   ABDOMINAL HYSTERECTOMY     partial hysterectomy 2017   TOTAL HIP ARTHROPLASTY Left 09/04/2020   TOTAL HIP ARTHROPLASTY Left 09/04/2020   Procedure: LEFT TOTAL HIP ARTHROPLASTY ANTERIOR APPROACH;  Surgeon: Tarry Kos, MD;  Location: [facility];   Service: Orthopedics;  Laterality: Left;   TUBAL LIGATION     Social History   Occupational History   Not on file  Tobacco Use   Smoking status: Never   Smokeless tobacco: Never  Vaping Use   Vaping Use: Never used  Substance and Sexual Activity   Alcohol use: Never   Drug use: Never   Sexual activity: Not on file

## 2021-08-28 ENCOUNTER — Ambulatory Visit: Payer: BC Managed Care – PPO | Admitting: Orthopaedic Surgery

## 2021-08-31 ENCOUNTER — Other Ambulatory Visit: Payer: Self-pay

## 2021-08-31 ENCOUNTER — Ambulatory Visit (INDEPENDENT_AMBULATORY_CARE_PROVIDER_SITE_OTHER): Payer: Managed Care, Other (non HMO) | Admitting: Orthopaedic Surgery

## 2021-08-31 ENCOUNTER — Ambulatory Visit (INDEPENDENT_AMBULATORY_CARE_PROVIDER_SITE_OTHER): Payer: Managed Care, Other (non HMO)

## 2021-08-31 DIAGNOSIS — Z96642 Presence of left artificial hip joint: Secondary | ICD-10-CM

## 2021-08-31 NOTE — Progress Notes (Signed)
? ?  Post-Op Visit Note ?  ?Patient: Shelly Burns           ?Date of Birth: 1965-07-19           ?MRN: [id number] ?Visit Date: 08/31/2021 ?PCP: Pcp, No ? ? ?Assessment & Plan: ? ?Chief Complaint:  ?Chief Complaint  ?Patient presents with  ? Left Hip - Follow-up  ? ?Visit Diagnoses:  ?1. History of total hip replacement, left   ? ? ?Plan: Patient is a pleasant 56 year old female comes in today 1 year status post left total hip replacement 09/04/2020.  She is doing great.  She notes occasional aches with increased activity but this has gradually improved over time.  Overall, very pleased with her outcome.  Examination of the left hip reveals full range of motion and strength.  Painless logroll.  She is neurovascular intact distally.  At this point, she will continue to advance with activity as tolerated.  Dental prophylaxis reinforced for another year.  Follow-up with [place] in 1 years time for repeat evaluation AP pelvis x-rays.  Call with concerns or questions. ? ?Follow-Up Instructions: Return in about 1 year (around 09/01/2022).  ? ?Orders:  ?Orders Placed This Encounter  ?Procedures  ? XR Pelvis 1-2 Views  ? ?No orders of the defined types were placed in this encounter. ? ? ?Imaging: ?No results found. ? ?PMFS History: ?Patient Active Problem List  ? Diagnosis Date Noted  ? Status post total replacement of left hip 09/04/2020  ? Primary osteoarthritis of left hip 09/03/2020  ? ?Past Medical History:  ?Diagnosis Date  ? Arthritis   ? left hip  ? Hypertension   ? Pneumonia   ?  ?No family history on file.  ?Past Surgical History:  ?Procedure Laterality Date  ? ABDOMINAL HYSTERECTOMY    ? partial hysterectomy 2017  ? TOTAL HIP ARTHROPLASTY Left 09/04/2020  ? TOTAL HIP ARTHROPLASTY Left 09/04/2020  ? Procedure: LEFT TOTAL HIP ARTHROPLASTY ANTERIOR APPROACH;  Surgeon: Tarry Kos, MD;  Location: [facility] OR;  Service: Orthopedics;  Laterality: Left;  ? TUBAL LIGATION    ? ?Social History  ? ?Occupational History  ? Not on file   ?Tobacco Use  ? Smoking status: Never  ? Smokeless tobacco: Never  ?Vaping Use  ? Vaping Use: Never used  ?Substance and Sexual Activity  ? Alcohol use: Never  ? Drug use: Never  ? Sexual activity: Not on file  ? ? ? ?

## 2022-05-24 ENCOUNTER — Encounter: Payer: Self-pay | Admitting: Medical

## 2022-06-25 IMAGING — CR DG CHEST 2V
2 series · 2 of 2 positions shown · non-contrast
Comparison: No priors.

CLINICAL DATA: 54-year-old female under preoperative evaluation
prior to left hip replacement.

EXAM:
CHEST - 2 VIEW

[w chest pa]
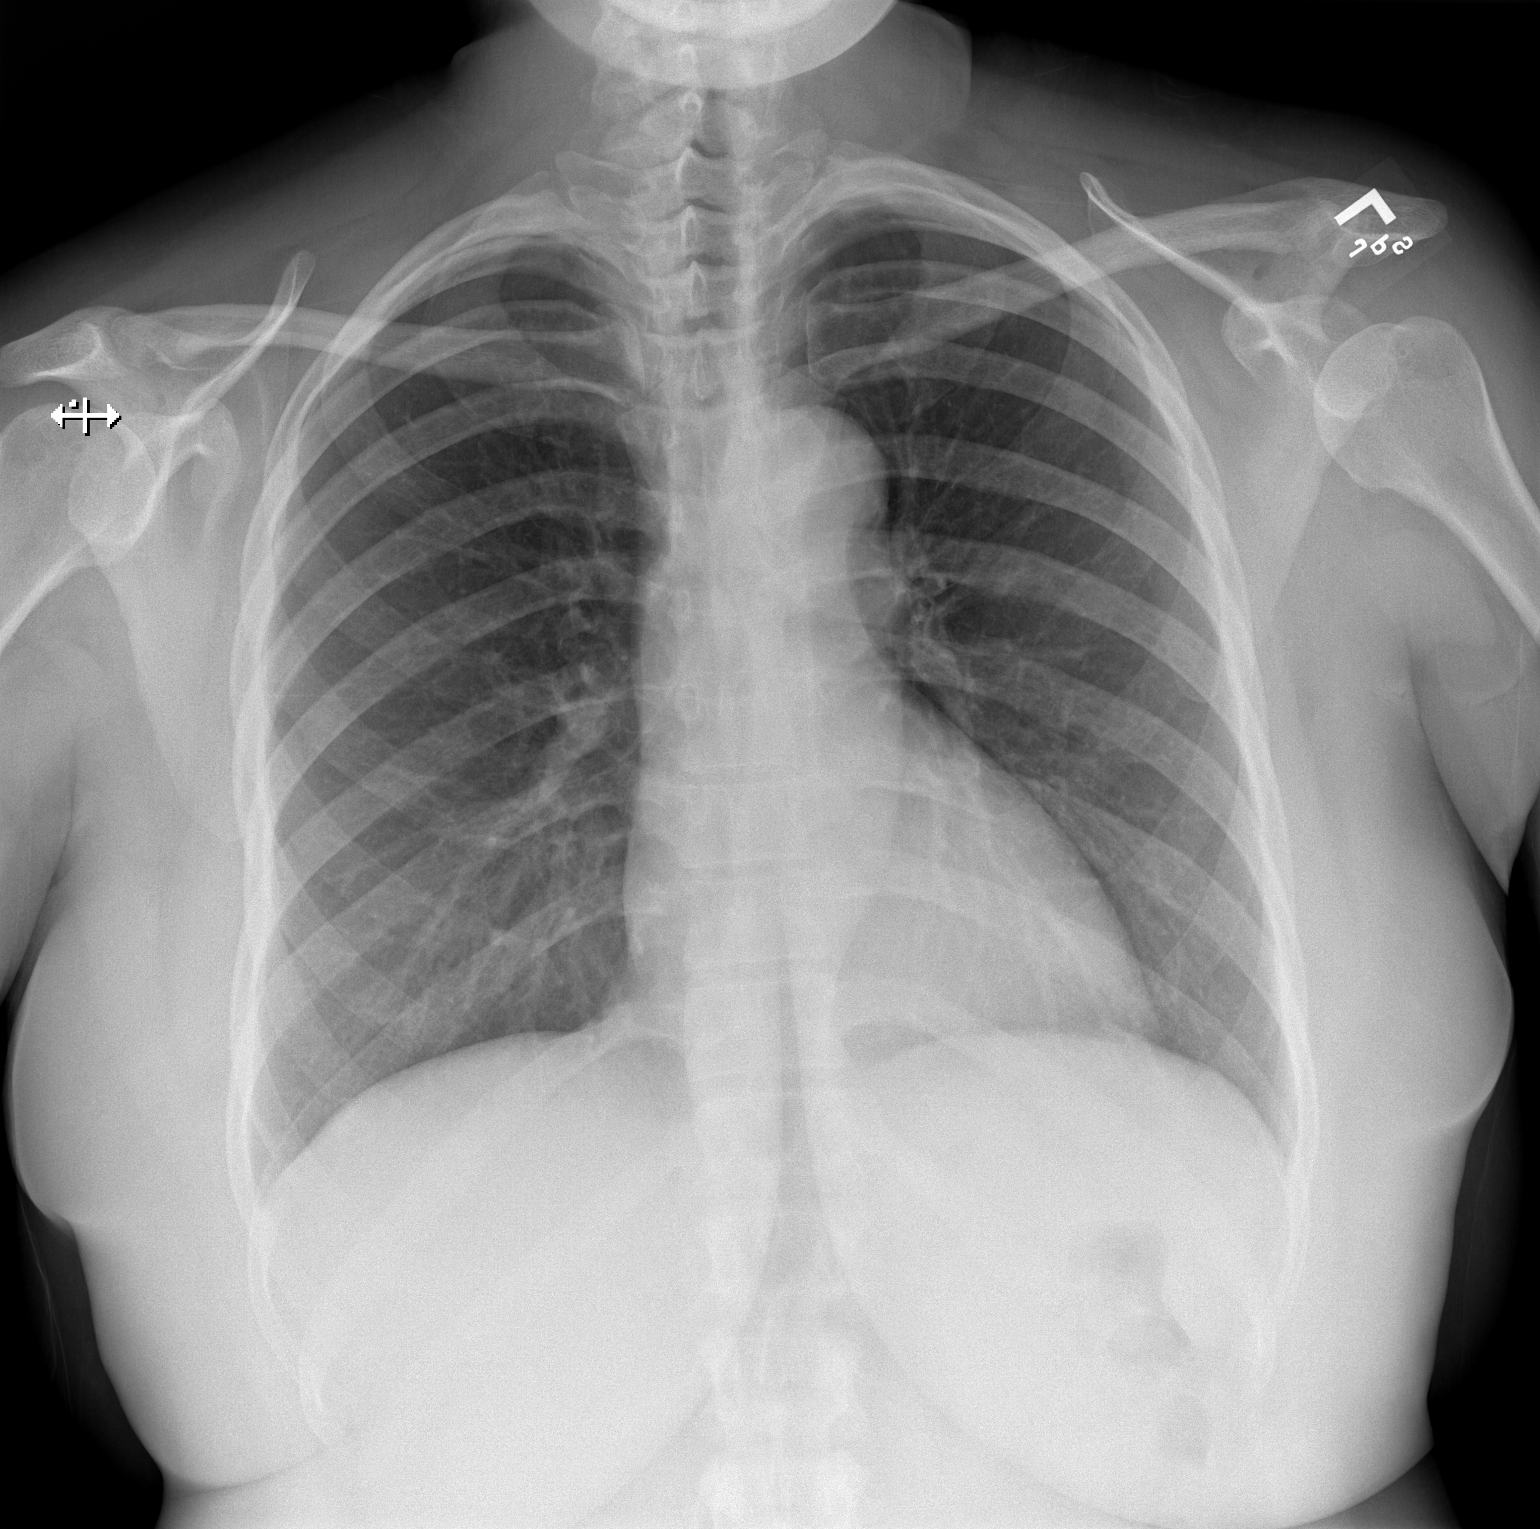

[w chest lat]
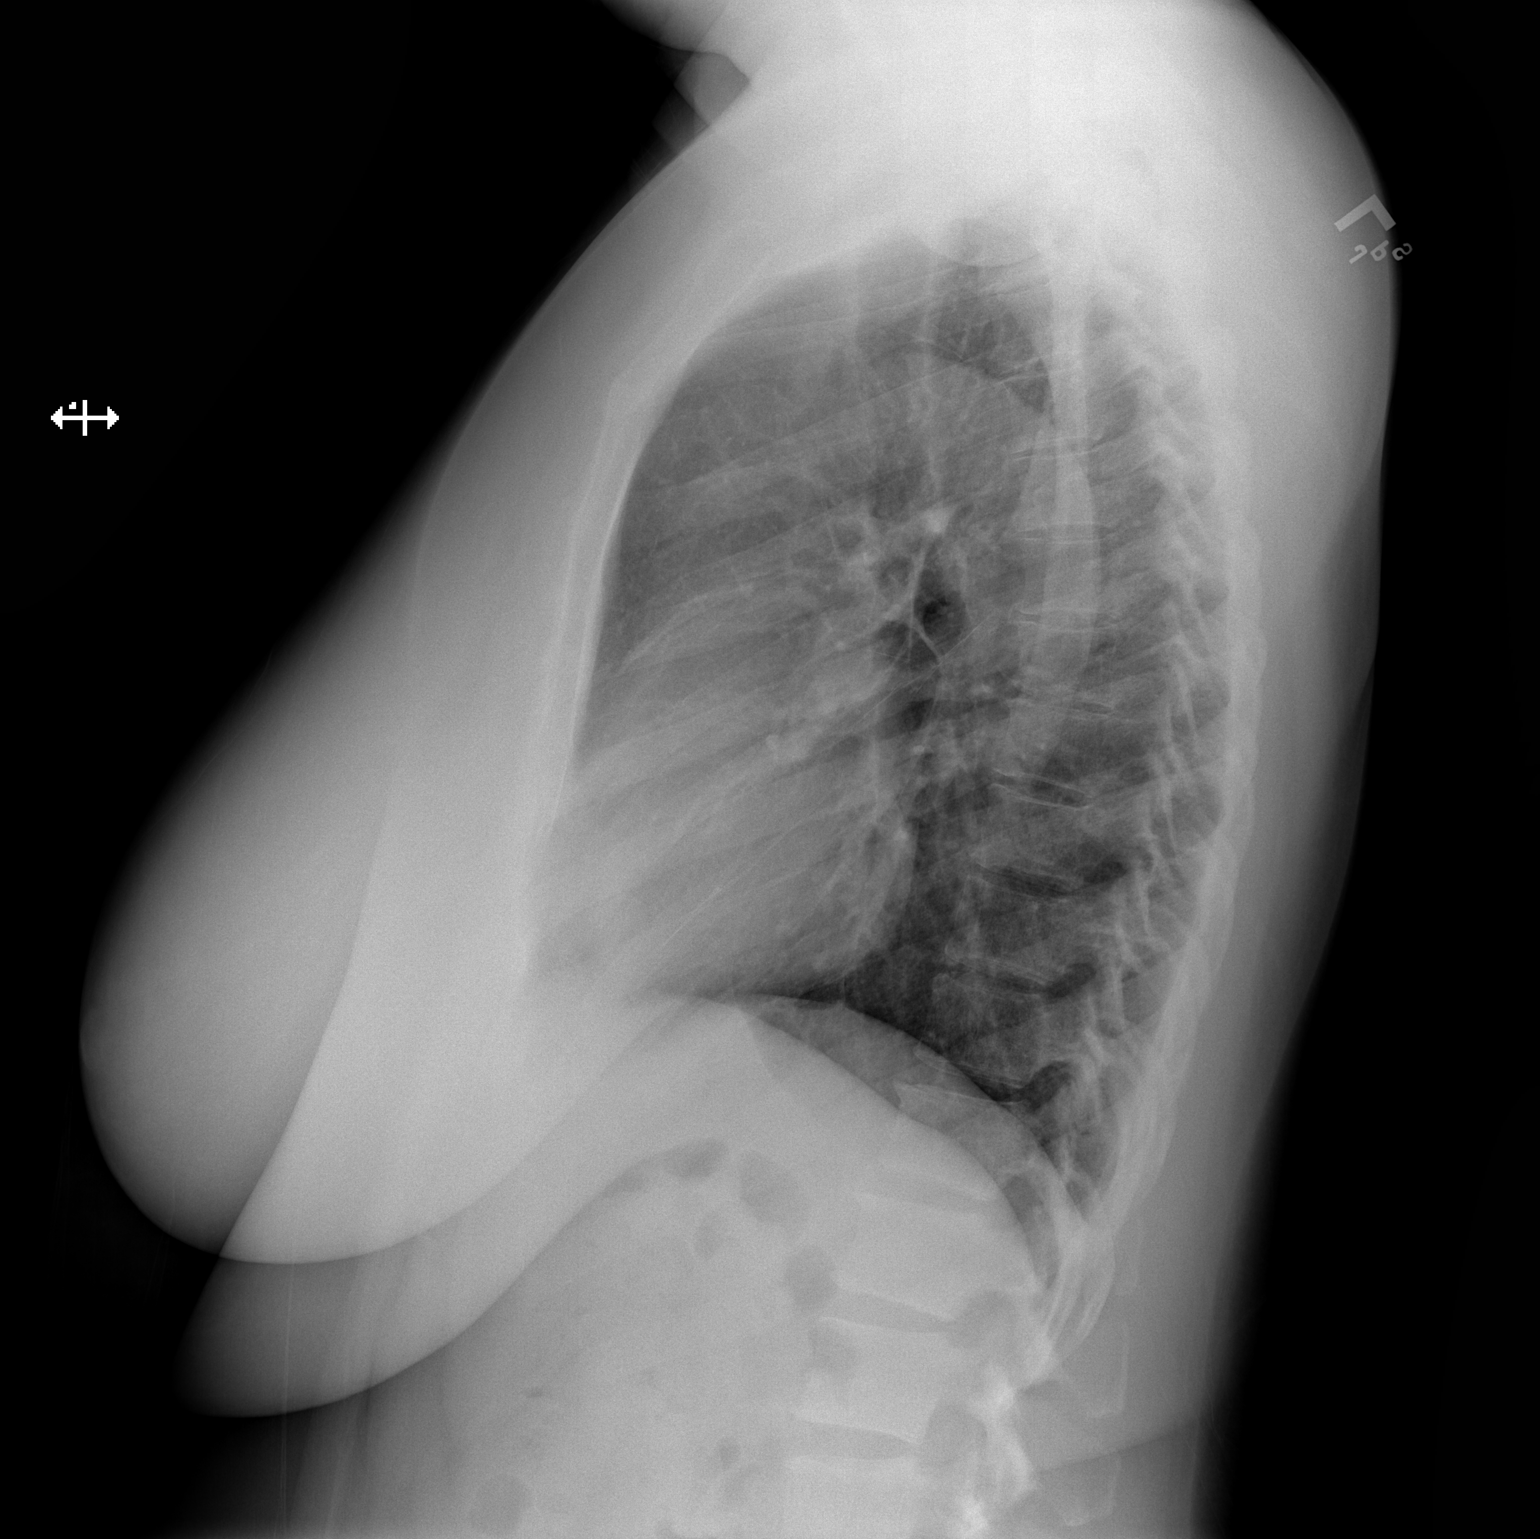

[2 of 2 positions shown; findings below may reference images not displayed]

FINDINGS: Lung volumes are normal. No consolidative airspace disease. No
pleural effusions. No pneumothorax. No pulmonary nodule or mass
noted. Pulmonary vasculature and the cardiomediastinal silhouette
are within normal limits.
IMPRESSION: No radiographic evidence of acute cardiopulmonary disease.

## 2022-06-30 IMAGING — RF DG C-ARM 1-60 MIN
1 series · 6 of 6 positions shown · non-contrast
Comparison: Radiographs of the left hip with pelvis 06/29/2020.

CLINICAL DATA: Provided history: Intraoperative. Additional
provided: Left anterior hip replacement. Provided fluoroscopy time
32 seconds.

EXAM:
DG C-ARM 1-60 MIN; OPERATIVE LEFT HIP WITH PELVIS

[Series 1: unknown protocol · 0.20mm/px · 6 of 6 slices shown]
[im 1/6]
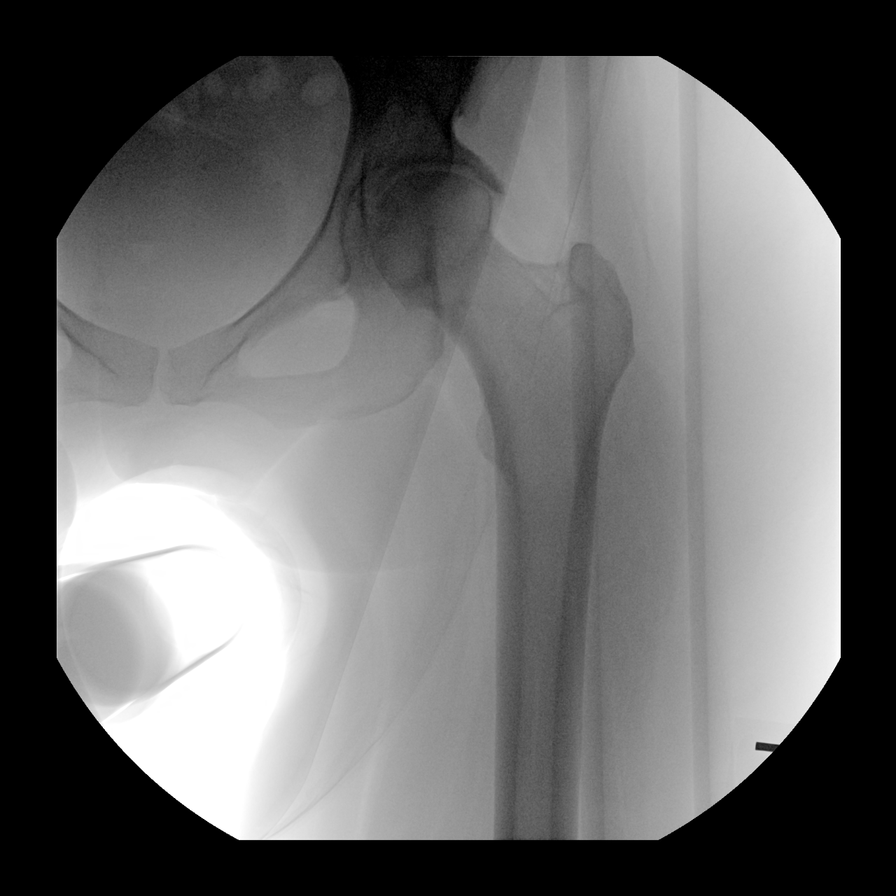
[im 2/6]
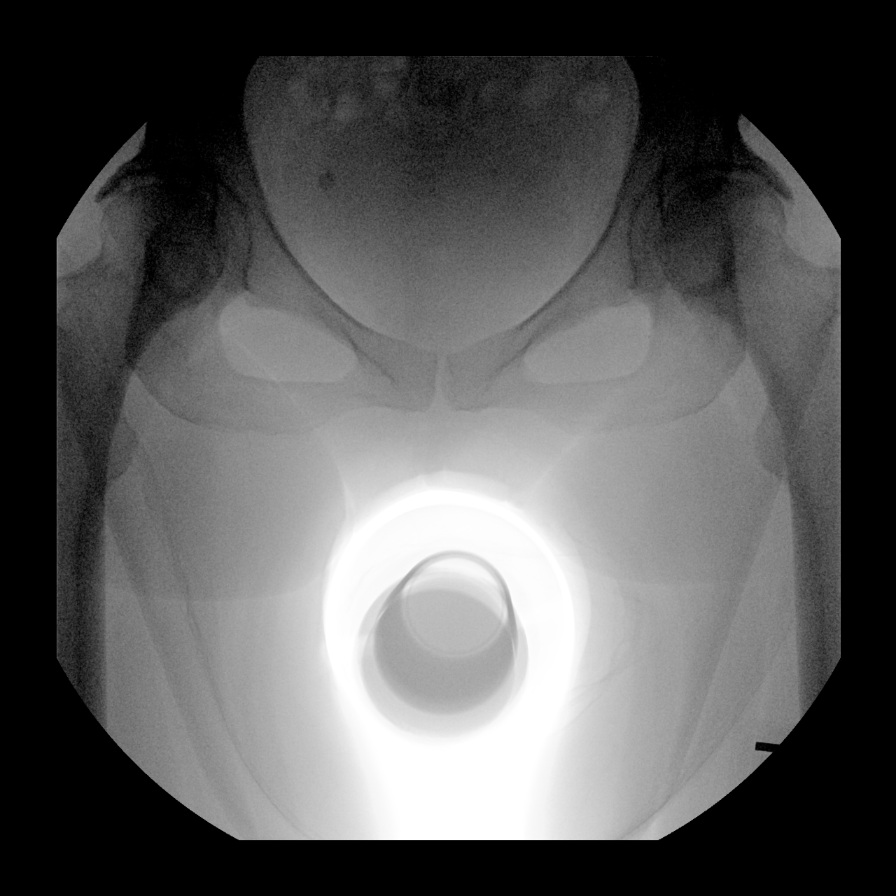
[im 3/6]
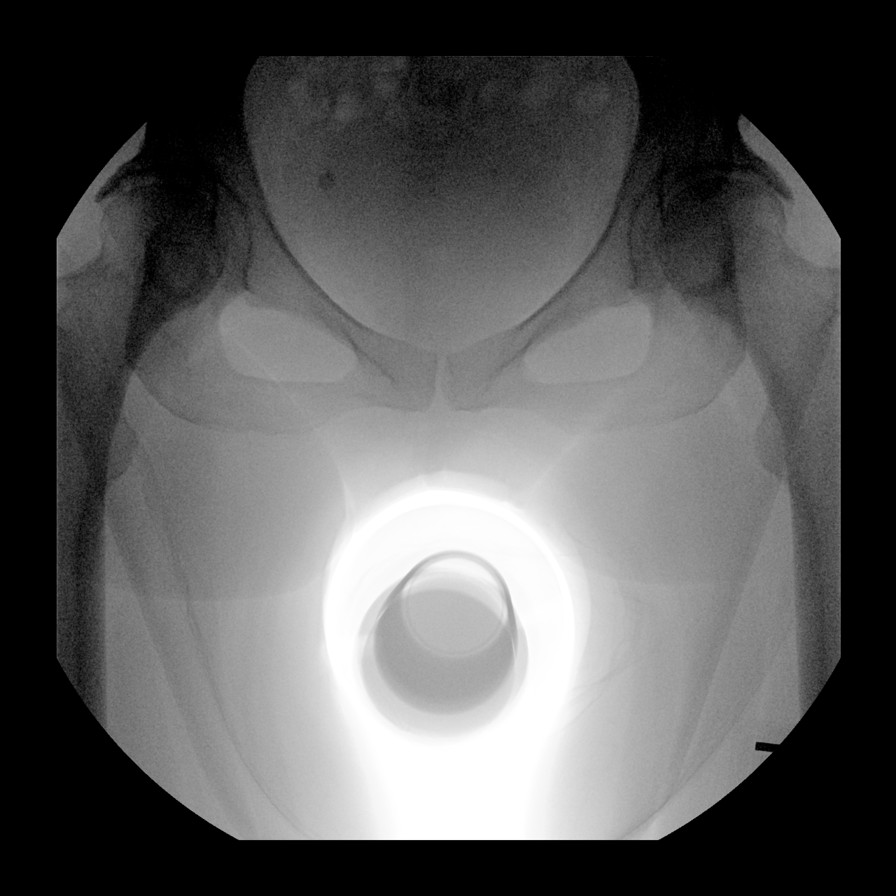
[im 4/6]
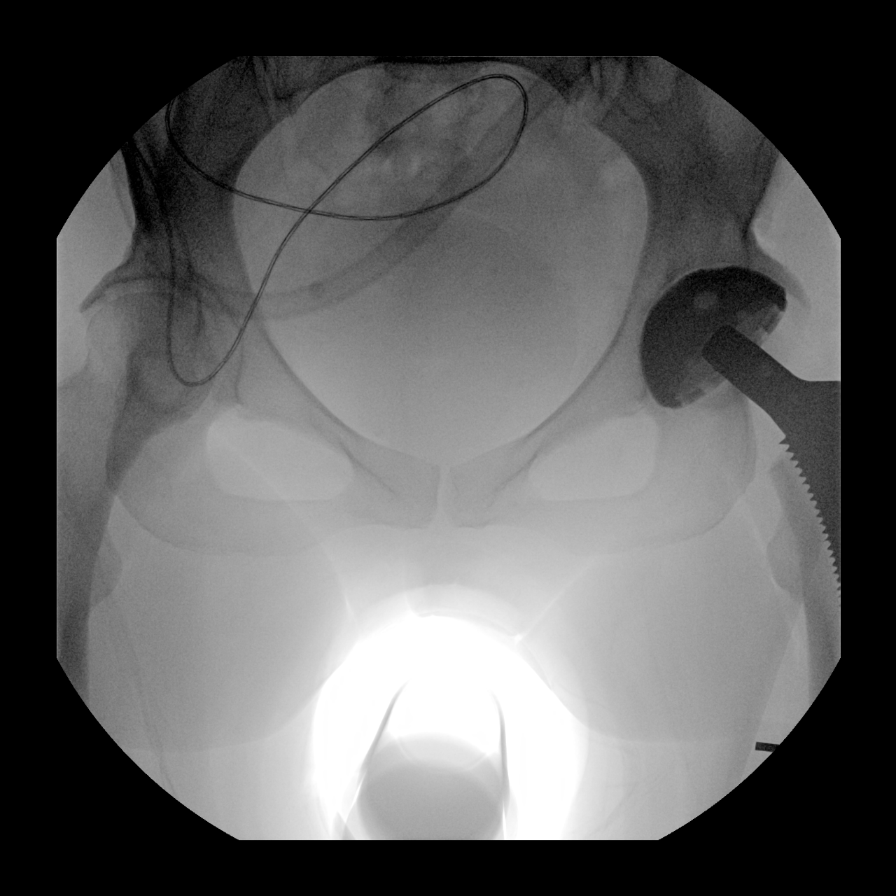
[im 5/6]
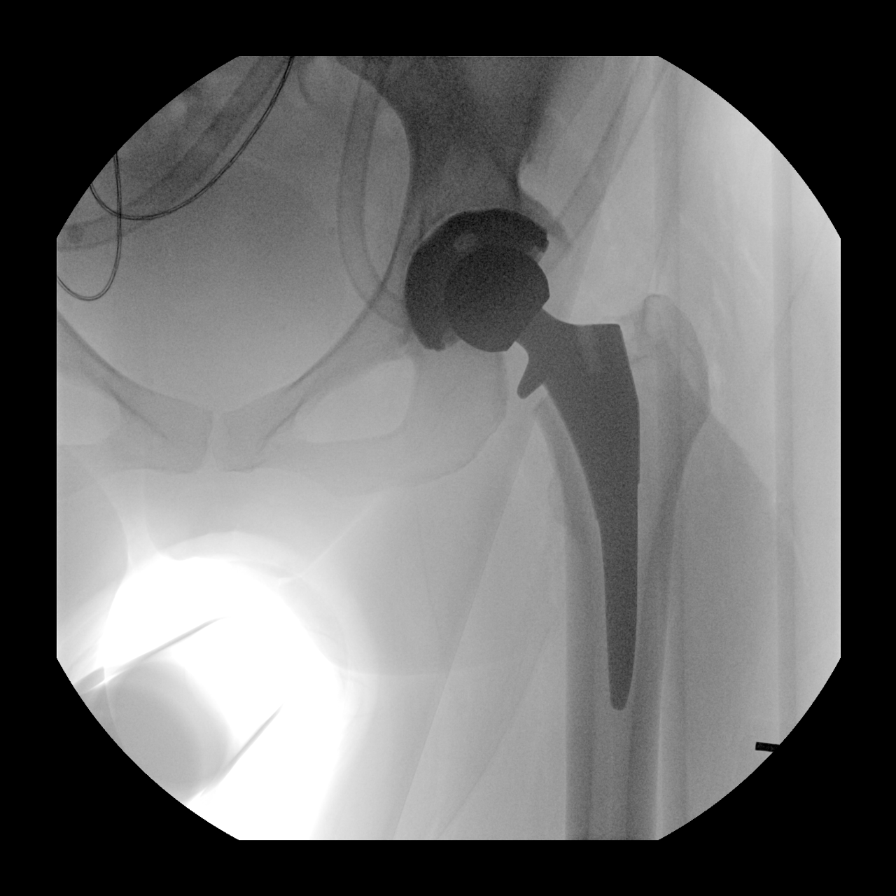
[im 6/6]
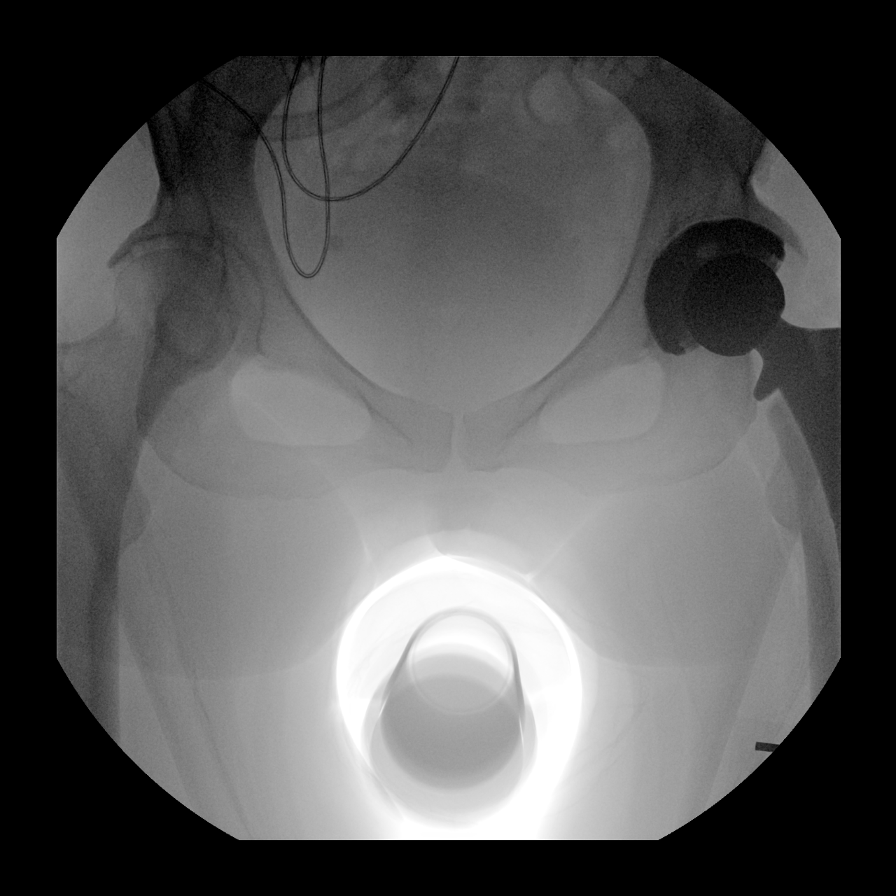

[6 of 6 positions shown; findings below may reference images not displayed]

FINDINGS: Five intraoperative fluoroscopic images are provided. There are no
laterality markers on the provided images. The images are presumably
of the left hip given the provided history. The images demonstrate
interval left total hip arthroplasty. The femoral and acetabular
components appear well seated. No unexpected finding.
IMPRESSION: Five intraoperative fluoroscopic images from left total hip
arthroplasty, as described.

## 2022-09-17 ENCOUNTER — Ambulatory Visit: Payer: Managed Care, Other (non HMO) | Admitting: Allergy & Immunology

## 2022-09-17 ENCOUNTER — Other Ambulatory Visit: Payer: Self-pay

## 2022-09-17 ENCOUNTER — Encounter: Payer: Self-pay | Admitting: Allergy & Immunology

## 2022-09-17 VITALS — BP 132/100 | HR 102 | Temp 97.8°F | Resp 16 | Ht 67.72 in | Wt 197.3 lb

## 2022-09-17 DIAGNOSIS — R220 Localized swelling, mass and lump, head: Secondary | ICD-10-CM

## 2022-09-17 DIAGNOSIS — R221 Localized swelling, mass and lump, neck: Secondary | ICD-10-CM | POA: Diagnosis not present

## 2022-09-17 NOTE — Patient Instructions (Addendum)
1. Lip swelling - Testing was negative to entire environmental allergy panel AND the food allergy panel. - There is a the low positive predictive value of food allergy testing and hence the high possibility of false positives. - In contrast, food allergy testing has a high negative predictive value, therefore if testing is negative we can be relatively assured that they are indeed negative.  - Testing results provided. - We are going to get some labs to rule out weird causes of swelling. - Because of the throat swelling, we are going to provide an EpiPen and anaphylaxis management plan.   2. Return in about 3 months (around 12/17/2022). You can have the follow up appointment with Dr. Ernst Bowler or a Nurse Practicioner (our Nurse Practitioners are excellent and always have Physician oversight!).    Please inform us of any Emergency Department visits, hospitalizations, or changes in symptoms. Call us before going to the ED for breathing or allergy symptoms since we might be able to fit you in for a sick visit. Feel free to contact us anytime with any questions, problems, or concerns.  It was a pleasure to meet you today!  Websites that have reliable patient information: 1. American Academy of Asthma, Allergy, and Immunology: www.aaaai.org 2. Food Allergy Research and Education (FARE): foodallergy.org 3. Mothers of Asthmatics: http://www.asthmacommunitynetwork.org 4. American College of Allergy, Asthma, and Immunology: www.acaai.org   COVID-19 Vaccine Information can be found at: ShippingScam.co.uk For questions related to vaccine distribution or appointments, please email [email or web address] or call [phone].   We realize that you might be concerned about having an allergic reaction to the COVID19 vaccines. To help with that concern, WE ARE OFFERING THE COVID19 VACCINES IN OUR OFFICE! Ask the front desk for dates!     "Like" [place]  on Facebook and Instagram for our latest updates!      A healthy democracy works best when [employer] participate! Make sure you are registered to vote! If you have moved or changed any of your contact information, you will need to get this updated before voting!  In some cases, you MAY be able to register to vote online: [email or web address]      Airborne Adult Perc - 09/17/22 1408     Time Antigen Placed [place] Lavella Hammock    Location Back    Number of Test 57    1. Control-Buffer 50% Glycerol Negative    2. Control-Histamine 1 mg/ml 4+    3. Albumin saline Negative    4. Hendricks Negative    5. [place] Negative    6. Johnson Negative    7. [place] Blue Negative    10. Sweet Vernal Negative    11. Timothy Negative    12. Cocklebur Negative    13. Burweed Marshelder Negative    14. Ragweed, short Negative    15. Ragweed, Giant Negative    16. Plantain,  English Negative    17. Lamb's Quarters Negative    18. Sheep Sorrell Negative    19. Rough Pigweed Negative    20. Marsh Elder, Rough Negative    21. Mugwort, Common Negative    22. Ash mix Negative    23. Birch mix Negative    24. Beech American Negative    25. Box, Elder Negative    26. Cedar, red Negative    27. Cottonwood, [place] Negative    28. Elm mix Negative    29. Hickory Negative    30. Maple mix Negative  [place], [place] mix Negative    32. Pecan Pollen Negative    33. Pine mix Negative    34. Sycamore Eastern Negative    35. Huntington, Black Pollen Negative    36. Alternaria alternata Negative    37. Cladosporium Herbarum Negative    38. Aspergillus mix Negative    39. Penicillium mix Negative    40. Bipolaris sorokiniana (Helminthosporium) Negative    41. Drechslera spicifera (Curvularia) Negative    42. Mucor plumbeus Negative    43. Fusarium moniliforme Negative    44. Aureobasidium pullulans (pullulara) Negative    45. Rhizopus oryzae Negative     46. Botrytis cinera Negative    47. Epicoccum nigrum Negative    48. Phoma betae Negative    49. Candida Albicans Negative    50. Trichophyton mentagrophytes Negative    51. Mite, D Farinae  5,000 AU/ml Negative    52. Mite, D Pteronyssinus  5,000 AU/ml Negative    53. Cat Hair 10,000 BAU/ml Negative    54.  Dog Epithelia Negative    55. Mixed Feathers Negative    56. Horse Epithelia Negative    57. Cockroach, German Negative    58. Mouse Negative    59. Tobacco Leaf Negative    Other Omitted    Other Omitted             Food Adult Perc - 09/17/22 1400     Time Antigen Placed 1413    Allergen Manufacturer Lavella Hammock    Location Back    Number of allergen test 17    1. Peanut Negative    2. Soybean Negative    3. Wheat Negative    4. Sesame Negative    5. Milk, cow Negative    6. Egg White, Chicken Negative    7. Casein Negative    8. Shellfish Mix Negative    9. Fish Mix Negative    10. Cashew Negative    11. Pecan Food Negative    12. Kleberg Negative    13. Almond Negative    14. Hazelnut Negative    15. [place] nut Negative    16. Coconut Negative    17. Pistachio Negative

## 2022-09-17 NOTE — Progress Notes (Signed)
NEW PATIENT  Date of Service/Encounter:  09/17/22  Consult requested by: Pcp, No   Assessment:   Lip swelling  Throat swelling  Plan/Recommendations:    1. Lip swelling - Testing was negative to entire environmental allergy panel AND the food allergy panel. - There is a the low positive predictive value of food allergy testing and hence the high possibility of false positives. - In contrast, food allergy testing has a high negative predictive value, therefore if testing is negative we can be relatively assured that they are indeed negative.  - Testing results provided. - We are going to get some labs to rule out weird causes of swelling. - Because of the throat swelling, we are going to provide an EpiPen and anaphylaxis management plan.  - The appropriate use of epinephrine was discussed, including indications for use and how to use it.   2. Return in about 3 months (around 12/17/2022). You can have the follow up appointment with Dr. Dellis Anes or a Nurse Practicioner (our Nurse Practitioners are excellent and always have Physician oversight!).     This note in its entirety was forwarded to the Provider who requested this consultation.  Subjective:   Shelly Burns is a 57 y.o. 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  Shelly Burns was referred by Pcp, No.     Shelly Burns is a 57 y.o. female presenting for an evaluation of environmental allergies as well as angioedema. She has had this 6-7 months or so. She has swelling of her mouth and lips . Steroids did help, but antihistamines have not helped at all.   She was  sitting on her couch when this first started. It was happening every other day for 6-7 months. This was initially happening in 2022. She just went to Urgent Care and was told that this is related to her blood pressure medication. She was changed from lisinopril to something else. She was changed to something else, but the other medication did not control her blood pressure. She restarted the lisinopril one month later and was fine with it. She has pictures of her bottom lip swelling. She did fine through 2023.   In February 2024, she was sitting on her cough playing with her grand baby. She noticed that her lip was swelling and it kept growing. She went to Urgent Care the next day and she got a steroid shot with improvement in her symptoms. It took around one day for it to go down. There was some pruritus and she was itching all over. She has noticed over the last couple of days that she has been breaking out in hives over her entire body.  She denies changes. She has not changed her environmental conditions at all. She does not use makeup much at all.    Otherwise, there is no history of other atopic diseases, including asthma, food allergies, drug allergies, environmental allergies, stinging insect allergies, or contact dermatitis. There is no significant infectious history. Vaccinations are up to date.    Past Medical History: Patient Active Problem List   Diagnosis Date Noted   Status post total  replacement of left hip 09/04/2020   Primary osteoarthritis of left hip 09/03/2020    Medication List:  Allergies as of 09/17/2022   No Known Allergies      Medication List        Accurate as of September 17, 2022 11:59 PM. If you have any questions, ask your nurse or doctor.          STOP taking these medications    acetaminophen 500 MG tablet Commonly known as: TYLENOL Stopped by: Alfonse Spruce, MD   aspirin EC 81 MG tablet Stopped by: Alfonse Spruce, MD   fluconazole 150  MG tablet Commonly known as: Diflucan Stopped by: Alfonse Spruce, MD   methocarbamol 500 MG tablet Commonly known as: Robaxin Stopped by: Alfonse Spruce, MD   ondansetron 4 MG tablet Commonly known as: Zofran Stopped by: Alfonse Spruce, MD   oxyCODONE-acetaminophen 5-325 MG tablet Commonly known as: Percocet Stopped by: Alfonse Spruce, MD       TAKE these medications    EPINEPHrine 0.3 mg/0.3 mL Soaj injection Commonly known as: EpiPen 2-Pak Inject 0.3 mg into the muscle as needed for anaphylaxis. Started by: Alfonse Spruce, MD   lisinopril-hydrochlorothiazide 20-25 MG tablet Commonly known as: ZESTORETIC Take 1 tablet by mouth daily.        Birth History: non-contributory  Developmental History: non-contributory  Past Surgical History: Past Surgical History:  Procedure Laterality Date   ABDOMINAL HYSTERECTOMY     partial hysterectomy 2017   TOTAL HIP ARTHROPLASTY Left 09/04/2020   TOTAL HIP ARTHROPLASTY Left 09/04/2020   Procedure: LEFT TOTAL HIP ARTHROPLASTY ANTERIOR APPROACH;  Surgeon: Tarry Kos, MD;  Location: [facility] OR;  Service: Orthopedics;  Laterality: Left;   TUBAL LIGATION       Family History: No family history on file.   Social History: Ashten lives at home with her family.  She lives in a house that is 56 years old.  There is carpeting throughout the home.  She has electric heating and central cooling.  There are no animals inside or outside of the home.  There are no dust mite covers on the bedding.  There is tobacco exposure in the house, but not the car.  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  There is no fume, chemical, or dust exposure at home, but there is in the workplace. There is no fume, chemical, or dust exposure.  Review of Systems  Constitutional: Negative.  Negative for chills, fever, malaise/fatigue and weight loss.  HENT: Negative.  Negative for congestion, ear  discharge and ear pain.   Eyes:  Negative for pain, discharge and redness.  Respiratory:  Negative for cough, sputum production, shortness of breath and wheezing.   Cardiovascular: Negative.  Negative for chest pain and palpitations.  Gastrointestinal:  Negative for abdominal pain, constipation, diarrhea, heartburn, nausea and vomiting.  Skin: Negative.  Negative for itching and rash.  Neurological:  Negative for dizziness and headaches.  Endo/Heme/Allergies:  Positive for environmental allergies. Does not bruise/bleed easily.       Objective:   Blood pressure (!) 132/100, pulse (!) 102, temperature 97.8 F (36.6 C), temperature source Temporal, resp. rate 16, height 5' 7.72" (1.72 m), weight 197 lb 4.8 oz (89.5 kg), last menstrual period 06/18/2015, SpO2 98 %. Body mass index is 30.25 kg/m.     Physical Exam Vitals reviewed.  Constitutional:      Appearance: She is well-developed.  HENT:     Head: Normocephalic and atraumatic.     Right Ear: Tympanic membrane, ear canal and external ear normal. No drainage, swelling or tenderness. Tympanic membrane is not injected, scarred, erythematous, retracted or bulging.     Left Ear: Tympanic membrane, ear canal and external ear normal. No drainage, swelling or tenderness. Tympanic membrane is not injected, scarred, erythematous, retracted or bulging.     Nose: No nasal deformity, septal deviation, mucosal edema or rhinorrhea.     Right Turbinates: Enlarged, swollen and pale.     Left Turbinates: Enlarged, swollen and pale.     Right Sinus: No maxillary sinus tenderness or frontal sinus tenderness.     Left Sinus: No maxillary sinus tenderness or frontal sinus tenderness.     Mouth/Throat:     Mouth: Mucous membranes are not pale and not dry.     Pharynx: Uvula midline.  Eyes:     General:        Right eye: No discharge.        Left eye: No discharge.     Conjunctiva/sclera: Conjunctivae normal.     Right eye: Right conjunctiva is  not injected. No chemosis.    Left eye: Left conjunctiva is not injected. No chemosis.    Pupils: Pupils are equal, round, and reactive to light.  Cardiovascular:     Rate and Rhythm: Normal rate and regular rhythm.     Heart sounds: Normal heart sounds.  Pulmonary:     Effort: Pulmonary effort is normal. No tachypnea, accessory muscle usage or respiratory distress.     Breath sounds: Normal breath sounds. No wheezing, rhonchi or rales.  Chest:     Chest wall: No tenderness.  Abdominal:     Tenderness: There is no abdominal tenderness. There is no guarding or rebound.  Lymphadenopathy:     Head:     Right side of head: No submandibular, tonsillar or occipital adenopathy.     Left side of head: No submandibular, tonsillar or occipital adenopathy.     Cervical: No cervical adenopathy.  Skin:    Coloration: Skin is not pale.     Findings: No abrasion, erythema, petechiae or rash. Rash is not papular, urticarial or vesicular.  Neurological:     Mental Status: She is alert.  Psychiatric:        Behavior: Behavior is cooperative.      Diagnostic studies:   Allergy Studies:     Airborne Adult Perc - 09/17/22 1408     Time Antigen Placed 1413    Allergen Manufacturer Waynette Buttery    Location Back    Number of Test 57    1. Control-Buffer 50% Glycerol Negative    2. Control-Histamine 1 mg/ml 4+    3. Albumin saline Negative    4. Bahia Negative    5. French Southern Territories Negative    6. Johnson Negative    7. Kentucky Blue Negative    10. Sweet Vernal Negative    11. Timothy Negative    12. Cocklebur Negative    13. Burweed Marshelder Negative    14. Ragweed, short Negative    15. Ragweed, Giant Negative    16. Plantain,  English Negative    17. Lamb's Quarters Negative    18. Sheep Sorrell Negative    19. Rough Pigweed Negative    20. Marsh Elder, Rough Negative    21. Mugwort, Common Negative    22. Ash mix Negative    23. Charletta Cousin mix Negative  24. Beech American Negative    25. Box,  Elder Negative    26. Cedar, red Negative    27. Cottonwood, Guinea-Bissau Negative    28. Elm mix Negative    29. Hickory Negative    30. Maple mix Negative    31. Oak, Guinea-Bissau mix Negative    32. Pecan Pollen Negative    33. Pine mix Negative    34. Sycamore Eastern Negative    35. Walnut, Black Pollen Negative    36. Alternaria alternata Negative    37. Cladosporium Herbarum Negative    38. Aspergillus mix Negative    39. Penicillium mix Negative    40. Bipolaris sorokiniana (Helminthosporium) Negative    41. Drechslera spicifera (Curvularia) Negative    42. Mucor plumbeus Negative    43. Fusarium moniliforme Negative    44. Aureobasidium pullulans (pullulara) Negative    45. Rhizopus oryzae Negative    46. Botrytis cinera Negative    47. Epicoccum nigrum Negative    48. Phoma betae Negative    49. Candida Albicans Negative    50. Trichophyton mentagrophytes Negative    51. Mite, D Farinae  5,000 AU/ml Negative    52. Mite, D Pteronyssinus  5,000 AU/ml Negative    53. Cat Hair 10,000 BAU/ml Negative    54.  Dog Epithelia Negative    55. Mixed Feathers Negative    56. Horse Epithelia Negative    57. Cockroach, German Negative    58. Mouse Negative    59. Tobacco Leaf Negative    Other Omitted    Other Omitted             Food Adult Perc - 09/17/22 1400     Time Antigen Placed 1413    Allergen Manufacturer Waynette Buttery    Location Back    Number of allergen test 17    1. Peanut Negative    2. Soybean Negative    3. Wheat Negative    4. Sesame Negative    5. Milk, cow Negative    6. Egg White, Chicken Negative    7. Casein Negative    8. Shellfish Mix Negative    9. Fish Mix Negative    10. Cashew Negative    11. Pecan Food Negative    12. Walnut Food Negative    13. Almond Negative    14. Hazelnut Negative    15. [place] nut Negative    16. Coconut Negative    17. Pistachio Negative             Allergy testing results were read and interpreted by myself,  documented by clinical staff.         Malachi Bonds, MD [facility]

## 2022-09-18 MED ORDER — EPINEPHRINE 0.3 MG/0.3ML IJ SOAJ: 0.3000 mg | INTRAMUSCULAR | 1 refills | Status: AC | PRN

## 2022-09-19 ENCOUNTER — Encounter: Payer: Self-pay | Admitting: Allergy & Immunology

## 2022-12-26 ENCOUNTER — Other Ambulatory Visit: Payer: Self-pay

## 2022-12-26 ENCOUNTER — Emergency Department (HOSPITAL_COMMUNITY)
Admission: EM | Admit: 2022-12-26 | Discharge: 2022-12-26 | Discharge status: Against Medical Advice | Payer: Managed Care, Other (non HMO) | Attending: Emergency Medicine | Admitting: Emergency Medicine

## 2022-12-26 DIAGNOSIS — Z5321 Procedure and treatment not carried out due to patient leaving prior to being seen by health care provider: Secondary | ICD-10-CM | POA: Diagnosis not present

## 2022-12-26 DIAGNOSIS — M79601 Pain in right arm: Secondary | ICD-10-CM | POA: Insufficient documentation

## 2022-12-26 NOTE — ED Triage Notes (Addendum)
Pt arrives to ED c/o right arm pain that starts in shoulder and radiates to elbow. Pain has been intermittent x 1 week. No injuries reported. Pt reports that she cannot lift her arm without assistant from other hand. "Pain feels like lighting down arm. Pt denies any other symptoms

## 2022-12-26 NOTE — ED Notes (Addendum)
Pt decided not to wait said her shoulder is feeling a little better

## 2023-04-27 ENCOUNTER — Encounter (HOSPITAL_COMMUNITY): Payer: Self-pay | Admitting: *Deleted

## 2023-04-27 ENCOUNTER — Emergency Department (HOSPITAL_COMMUNITY)
Admission: EM | Admit: 2023-04-27 | Discharge: 2023-04-27 | Disposition: A | Discharge status: Home / Self Care | Payer: Managed Care, Other (non HMO) | Attending: Emergency Medicine | Admitting: Emergency Medicine

## 2023-04-27 ENCOUNTER — Other Ambulatory Visit: Payer: Self-pay

## 2023-04-27 ENCOUNTER — Emergency Department (HOSPITAL_COMMUNITY): Payer: Managed Care, Other (non HMO)

## 2023-04-27 DIAGNOSIS — Z96642 Presence of left artificial hip joint: Secondary | ICD-10-CM | POA: Insufficient documentation

## 2023-04-27 DIAGNOSIS — I10 Essential (primary) hypertension: Secondary | ICD-10-CM | POA: Diagnosis not present

## 2023-04-27 DIAGNOSIS — Z79899 Other long term (current) drug therapy: Secondary | ICD-10-CM | POA: Insufficient documentation

## 2023-04-27 DIAGNOSIS — R079 Chest pain, unspecified: Secondary | ICD-10-CM

## 2023-04-27 DIAGNOSIS — R0789 Other chest pain: Secondary | ICD-10-CM | POA: Diagnosis present

## 2023-04-27 LAB — BASIC METABOLIC PANEL
Anion gap: 9 (ref 5–15)
BUN: 9 mg/dL (ref 6–20)
CO2: 25 mmol/L (ref 22–32)
Calcium: 9.5 mg/dL (ref 8.9–10.3)
Chloride: 106 mmol/L (ref 98–111)
Creatinine, Ser: 1.03 mg/dL — ABNORMAL HIGH (ref 0.44–1.00)
GFR, Estimated: >60 mL/min (ref >60)
Glucose, Bld: 124 mg/dL — ABNORMAL HIGH (ref 70–99)
Potassium: 3.1 mmol/L — ABNORMAL LOW (ref 3.5–5.1)
Sodium: 140 mmol/L (ref 135–145)

## 2023-04-27 LAB — HEPATIC FUNCTION PANEL
ALT: 12 U/L (ref 0–44)
AST: 16 U/L (ref 15–41)
Albumin: 3.6 g/dL (ref 3.5–5.0)
Alkaline Phosphatase: 65 U/L (ref 38–126)
Bilirubin, Direct: 0.1 mg/dL (ref 0.0–0.2)
Indirect Bilirubin: 0.7 mg/dL (ref 0.3–0.9)
Total Bilirubin: 0.8 mg/dL (ref <1.2)
Total Protein: 7.4 g/dL (ref 6.5–8.1)

## 2023-04-27 LAB — CBC
HCT: 38.1 % (ref 36.0–46.0)
Hemoglobin: 12.8 g/dL (ref 12.0–15.0)
MCH: 29.1 pg (ref 26.0–34.0)
MCHC: 33.6 g/dL (ref 30.0–36.0)
MCV: 86.6 fL (ref 80.0–100.0)
Platelets: 310 10*3/uL (ref 150–400)
RBC: 4.4 MIL/uL (ref 3.87–5.11)
RDW: 11.9 % (ref 11.5–15.5)
WBC: 6.3 10*3/uL (ref 4.0–10.5)
nRBC: 0 % (ref 0.0–0.2)

## 2023-04-27 LAB — TROPONIN I (HIGH SENSITIVITY)
Troponin I (High Sensitivity): 4 ng/L (ref <18)
Troponin I (High Sensitivity): 5 ng/L (ref <18)

## 2023-04-27 MED ORDER — MORPHINE SULFATE (PF) 4 MG/ML IV SOLN
4.0000 mg | Freq: Once | INTRAVENOUS | Status: AC
  Administered 2023-04-27: 4 mg via INTRAVENOUS
  Filled 2023-04-27: qty 1

## 2023-04-27 MED ORDER — IOHEXOL 350 MG/ML SOLN
100.0000 mL | Freq: Once | INTRAVENOUS | Status: AC | PRN
  Administered 2023-04-27: 100 mL via INTRAVENOUS

## 2023-04-27 MED ORDER — KETOROLAC TROMETHAMINE 30 MG/ML IJ SOLN
30.0000 mg | Freq: Once | INTRAMUSCULAR | Status: AC
  Administered 2023-04-27: 30 mg via INTRAVENOUS
  Filled 2023-04-27: qty 1

## 2023-04-27 MED ORDER — ONDANSETRON HCL 4 MG/2ML IJ SOLN
4.0000 mg | Freq: Once | INTRAMUSCULAR | Status: AC
Start: 2023-04-27 — End: 2023-04-27
  Administered 2023-04-27: 4 mg via INTRAVENOUS
  Filled 2023-04-27: qty 2

## 2023-04-27 NOTE — ED Triage Notes (Signed)
Pt here [facility] from [facility] for acute onset R sided chest pain that radiates to R upper back.  Pain increases with inspiration and movement.    Given 2 sl nitroglycerine and 324 asa with minimal relief.

## 2023-04-27 NOTE — ED Provider Notes (Signed)
[place] EMERGENCY DEPARTMENT AT [facility] Provider Note  CSN: [id number] Arrival date & time: 04/27/23 1529  Chief Complaint(s) Chest Pain  HPI Shelly Burns is a 57 y.o. female here today for chest pain.  Patient says she was at work, began to have sharp pain on the right side of her chest that radiates into her back.  Says it is worse with deep breath.  EMS gave the patient sublingual nitro, and aspirin without relief of symptoms.   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 Authorizing Provider  EPINEPHrine (EPIPEN 2-PAK) 0.3 mg/0.3 mL IJ SOAJ injection Inject 0.3 mg into the muscle as needed for anaphylaxis. 09/18/22   Alfonse Spruce, MD  lisinopril-hydrochlorothiazide (ZESTORETIC) 20-25 MG tablet Take 1 tablet by mouth daily. 04/03/20   [provider]                                                                                                                                    Past Surgical History Past Surgical History:  Procedure Laterality Date   ABDOMINAL HYSTERECTOMY     partial hysterectomy 2017   TOTAL HIP ARTHROPLASTY Left 09/04/2020   TOTAL HIP ARTHROPLASTY Left 09/04/2020   Procedure: LEFT TOTAL HIP ARTHROPLASTY ANTERIOR APPROACH;  Surgeon: Tarry Kos, MD;  Location: [facility] OR;  Service: Orthopedics;  Laterality: Left;   TUBAL LIGATION     Family History History reviewed. No pertinent family history.  Social History Social History   Tobacco Use   Smoking status: Never    Passive exposure: Current   Smokeless tobacco: Never  Vaping Use   Vaping status: Never Used  Substance Use Topics   Alcohol  use: Never   Drug use: Never   Allergies Patient has no known allergies.  Review of Systems Review of Systems  Physical Exam Vital Signs  I have reviewed the triage vital signs BP (!) 161/104   Pulse 83   Temp 98.2 F (36.8 C) (Oral)   Resp 15   Ht 5\' 8"  (1.727 m)   Wt 88.5 kg   LMP 06/18/2015 Comment: partial hysterectomy  SpO2 99%   BMI 29.65 kg/m   Physical Exam Vitals reviewed.  Cardiovascular:     Heart sounds: Normal heart sounds.  Pulmonary:     Effort: Pulmonary effort is normal. No tachypnea or respiratory distress.     Breath sounds: Normal breath sounds.  Chest:     Chest wall: Tenderness present.  Abdominal:     General: There is no abdominal bruit.  Palpations: Abdomen is soft. There is no hepatomegaly or mass.     Tenderness: There is no guarding.  Musculoskeletal:     Cervical back: Normal range of motion.  Neurological:     Mental Status: She is alert.     ED Results and Treatments Labs (all labs ordered are listed, but only abnormal results are displayed) Labs Reviewed  BASIC METABOLIC PANEL - Abnormal; Notable for the following components:      Result Value   Potassium 3.1 (*)    Glucose, Bld 124 (*)    Creatinine, Ser 1.03 (*)    All other components within normal limits  CBC  HEPATIC FUNCTION PANEL  TROPONIN I (HIGH SENSITIVITY)  TROPONIN I (HIGH SENSITIVITY)                                                                                                                          Radiology CT Angio Chest/Abd/Pel for Dissection W and/or Wo Contrast  Result Date: 04/27/2023 CLINICAL DATA:  Acute aortic syndrome suspected EXAM: CT ANGIOGRAPHY CHEST, ABDOMEN AND PELVIS TECHNIQUE: Non-contrast CT of the chest was initially obtained. Multidetector CT imaging through the chest, abdomen and pelvis was performed using the standard protocol during bolus administration of intravenous contrast. Multiplanar reconstructed images and MIPs were  obtained and reviewed to evaluate the vascular anatomy. RADIATION DOSE REDUCTION: This exam was performed according to the departmental dose-optimization program which includes automated exposure control, adjustment of the mA and/or kV according to patient size and/or use of iterative reconstruction technique. CONTRAST:  OMNIPAQUE IOHEXOL 350 MG/ML SOLN COMPARISON:  None Available. FINDINGS: CTA CHEST FINDINGS VASCULAR Aorta: Satisfactory opacification of the aorta. Normal contour and caliber of the thoracic aorta. No evidence of aneurysm, dissection, or other acute aortic pathology. Cardiovascular: No evidence of pulmonary embolism on limited non-tailored examination. Normal heart size. No pericardial effusion. Review of the MIP images confirms the above findings. NON VASCULAR Mediastinum/Nodes: No enlarged mediastinal, hilar, or axillary lymph nodes. Thymic remnant in the anterior mediastinum. Thyroid gland, trachea, and esophagus demonstrate no significant findings. Lungs/Pleura: Lungs are clear. No pleural effusion or pneumothorax. Musculoskeletal: No chest wall abnormality. No acute osseous findings. Review of the MIP images confirms the above findings. CTA ABDOMEN AND PELVIS FINDINGS VASCULAR Normal contour and caliber of the abdominal aorta. No evidence of aneurysm, dissection, or other acute aortic pathology. Standard branching pattern of the abdominal aorta with solitary bilateral renal arteries. Review of the MIP images confirms the above findings. NON-VASCULAR Hepatobiliary: No solid liver abnormality is seen. Benign fluid attenuation liver cysts, for which no further follow-up or characterization is required. No gallstones, gallbladder wall thickening, or biliary dilatation. Pancreas: Unremarkable. No pancreatic ductal dilatation or surrounding inflammatory changes. Spleen: Normal in size without significant abnormality. Adrenals/Urinary Tract: Adrenal glands are unremarkable. Lobulated right renal  cortical scarring, sequelae of prior infectious or obstructive insult. Kidneys are otherwise normal, without renal calculi, solid lesion, or hydronephrosis. Bladder is unremarkable. Stomach/Bowel: Stomach  is within normal limits. Appendix appears normal. No evidence of bowel wall thickening, distention, or inflammatory changes. Sigmoid diverticula. Lymphatic: No enlarged abdominal or pelvic lymph nodes. Reproductive: No mass or other significant abnormality. Other: No abdominal wall hernia or abnormality. Trace free fluid in the low pelvis. Musculoskeletal: No acute osseous findings. Status post left hip total arthroplasty. IMPRESSION: 1. Normal contour and caliber of the thoracic and abdominal aorta. No evidence of aneurysm, dissection, or other acute aortic pathology. No significant atherosclerosis. 2. Trace free fluid in the low pelvis, nonspecific and of uncertain significance. 3. Sigmoid diverticulosis without evidence of acute diverticulitis. Electronically Signed   By: Jearld Lesch M.D.   On: 04/27/2023 18:34   DG Chest Port 1 View  Result Date: 04/27/2023 CLINICAL DATA:  Chest pain EXAM: PORTABLE CHEST 1 VIEW COMPARISON:  08/30/2020 FINDINGS: Midline trachea.  Normal heart size and mediastinal contours. Sharp costophrenic angles.  No pneumothorax.  Clear lungs. Numerous leads and wires project over the chest. IMPRESSION: No active disease. Electronically Signed   By: Jeronimo Greaves M.D.   On: 04/27/2023 16:44    Pertinent labs & imaging results that were available during my care of the patient were reviewed by me and considered in my medical decision making (see MDM for details).  Medications Ordered in ED Medications  morphine (PF) 4 MG/ML injection 4 mg (4 mg Intravenous Given 04/27/23 1653)  ondansetron (ZOFRAN) injection 4 mg (4 mg Intravenous Given 04/27/23 1653)  iohexol (OMNIPAQUE) 350 MG/ML injection 100 mL (100 mLs Intravenous Contrast Given 04/27/23 1745)  ketorolac (TORADOL) 30 MG/ML  injection 30 mg (30 mg Intravenous Given 04/27/23 2002)                                                                                                                                     Procedures Procedures  (including critical care time)  Medical Decision Making / ED Course   This patient presents to the ED for concern of right-sided chest pain, this involves an extensive number of treatment options, and is a complaint that carries with it a high risk of complications and morbidity.  The differential diagnosis includes ACS, costochondritis, Vernona Rieger C, aortic dissection, considered pulmonary embolism,  MDM: Patient's initial EKG does not show any evidence of ischemia.  My independent review of the patient's chest x-ray does not show any pneumothorax or pneumonia.  With her symptoms, will obtain CT angiography to assess for dissection.  Troponin testing ordered.  Patient did have reproducible chest wall pain.  Reassessment 835-patient with 2 times negative high-sensitivity troponin.  Her CT angiography was negative.  Chest x-ray negative.  Symptoms have improved.  Provided Toradol now the dissection has been ruled out.  Discussed this with the patient, she was agreeable with discharge.  Patient's heart score is 3.  Additional history obtained: -Additional history obtained from  -External records from outside source obtained and reviewed including: Chart  review including previous notes, labs, imaging, consultation notes   Lab Tests: -I ordered, reviewed, and interpreted labs.   The pertinent results include:   Labs Reviewed  BASIC METABOLIC PANEL - Abnormal; Notable for the following components:      Result Value   Potassium 3.1 (*)    Glucose, Bld 124 (*)    Creatinine, Ser 1.03 (*)    All other components within normal limits  CBC  HEPATIC FUNCTION PANEL  TROPONIN I (HIGH SENSITIVITY)  TROPONIN I (HIGH SENSITIVITY)      EKG sinus rhythm, no evidence of acute ischemia.  My  independent review.  EKG Interpretation Date/Time:  Sunday April 27 2023 15:48:46 EST Ventricular Rate:  92 PR Interval:  182 QRS Duration:  104 QT Interval:  383 QTC Calculation: 474 R Axis:   -58  Text Interpretation: Sinus rhythm Probable left atrial enlargement Left anterior fascicular block Left ventricular hypertrophy Anterior Q waves, possibly due to LVH Confirmed by Anders Simmonds [phone]) on 04/27/2023 3:52:34 PM         Imaging Studies ordered: I ordered imaging studies including chest x-ray, CT imaging of the chest I independently visualized and interpreted imaging. I agree with the radiologist interpretation   Medicines ordered and prescription drug management: Meds ordered this encounter  Medications   morphine (PF) 4 MG/ML injection 4 mg   ondansetron (ZOFRAN) injection 4 mg   iohexol (OMNIPAQUE) 350 MG/ML injection 100 mL   ketorolac (TORADOL) 30 MG/ML injection 30 mg    -I have reviewed the patients home medicines and have made adjustments as needed   Cardiac Monitoring: The patient was maintained on a cardiac monitor.  I personally viewed and interpreted the cardiac monitored which showed an underlying rhythm of: Sinus rhythm  Social Determinants of Health:  Factors impacting patients care include:    Reevaluation: After the interventions noted above, I reevaluated the patient and found that they have :improved  Co morbidities that complicate the patient evaluation  Past Medical History:  Diagnosis Date   Arthritis    left hip   Hypertension    Pneumonia       Dispostion: I considered admission for this patient, however with her workup, and reduced risk factors, did not believe it was indicated.     Final Clinical Impression(s) / ED Diagnoses Final diagnoses:  Chest pain, unspecified type     @PCDICTATION @    Anders Simmonds T, DO 04/27/23 2038

## 2023-04-27 NOTE — Discharge Instructions (Addendum)
While you were in the emergency department, you had an EKG done that was normal.  You had blood work to look for injury to your heart and that was normal.  You had a CT scan to look at your aorta and your lungs.  This test was also normal.  I think that your symptoms are likely being caused by some muscular pain in your chest.  You can take 400 mg of ibuprofen every 6 hours, 1000 mg of Tylenol every 8 hours.  I would like you to call your primary care doctor this week to set up a follow-up appointment.  Return to the emergency department develop any new or worsening chest pain, nausea, difficulty breathing or lose consciousness.

## 2023-05-20 ENCOUNTER — Encounter: Payer: Self-pay | Admitting: Cardiology

## 2023-05-20 ENCOUNTER — Ambulatory Visit: Payer: Managed Care, Other (non HMO) | Attending: Cardiology | Admitting: Cardiology

## 2023-05-20 VITALS — BP 144/94 | HR 74 | Ht 68.0 in | Wt 199.2 lb

## 2023-05-20 DIAGNOSIS — R072 Precordial pain: Secondary | ICD-10-CM | POA: Diagnosis not present

## 2023-05-20 DIAGNOSIS — Z13220 Encounter for screening for lipoid disorders: Secondary | ICD-10-CM

## 2023-05-20 DIAGNOSIS — R9431 Abnormal electrocardiogram [ECG] [EKG]: Secondary | ICD-10-CM | POA: Diagnosis not present

## 2023-05-20 DIAGNOSIS — I10 Essential (primary) hypertension: Secondary | ICD-10-CM | POA: Diagnosis not present

## 2023-05-20 MED ORDER — METOPROLOL TARTRATE 100 MG PO TABS: 100.0000 mg | ORAL_TABLET | Freq: Once | ORAL | 0 refills | Status: AC

## 2023-05-20 NOTE — Patient Instructions (Addendum)
Medication Instructions:  Continue all current medications  *If you need a refill on your cardiac medications before your next appointment, please call your pharmacy*   Lab Work: Bmet, lipid panel, mg, ESR, CRP If you have labs (blood work) drawn today and your tests are completely normal, you will receive your results only by: MyChart Message (if you have MyChart) OR A paper copy in the mail If you have any lab test that is abnormal or we need to change your treatment, we will call you to review the results.   Testing/Procedures: Echo Your physician has requested that you have an echocardiogram. Echocardiography is a painless test that uses sound waves to create images of your heart. It provides your doctor with information about the size and shape of your heart and how well your heart's chambers and valves are working. This procedure takes approximately one hour. There are no restrictions for this procedure. Please do NOT wear cologne, perfume, aftershave, or lotions (deodorant is allowed). Please arrive 15 minutes prior to your appointment time.  Please note: We ask at that you not bring children with you during ultrasound (echo/ vascular) testing. Due to room size and safety concerns, children are not allowed in the ultrasound rooms during exams. Our front office staff cannot provide observation of children in our lobby area while testing is being conducted. An adult accompanying a patient to their appointment will only be allowed in the ultrasound room at the discretion of the ultrasound technician under special circumstances. We apologize for any inconvenience.    Follow-Up: At [facility], you and your health needs are our priority.  As part of our continuing mission to provide you with exceptional heart care, we have created designated Provider Care Teams.  These Care Teams include your primary Cardiologist (physician) and Advanced Practice Providers (APPs -  Physician  Assistants and Nurse Practitioners) who all work together to provide you with the care you need, when you need it.  We recommend signing up for the patient portal called "MyChart".  Sign up information is provided on this After Visit Summary.  MyChart is used to connect with patients for Virtual Visits (Telemedicine).  Patients are able to view lab/test results, encounter notes, upcoming appointments, etc.  Non-urgent messages can be sent to your provider as well.   To learn more about what you can do with MyChart, go to [email or web address].    Your next appointment:   March 11 @11 :40  Provider:   Dr. Bjorn Pippin  Other Instructions   Please check blood pressure twice a day for one week and send via mychart    Your cardiac CT will be scheduled at one of the below locations:   [facility] [address] [place], [place] [address] [phone]    If scheduled at [facility], please arrive at the [facility] and Children's Entrance (Entrance C2) of [facility] 30 minutes prior to test start time. You can use the FREE valet parking offered at entrance C (encouraged to control the heart rate for the test)  Proceed to the [facility] Radiology Department (first floor) to check-in and test prep.  All radiology patients and guests should use entrance C2 at [facility], accessed from [facility], even though the hospital's physical address listed is [address].      Please follow these instructions carefully (unless otherwise directed):  An IV will be required for this test and Nitroglycerin will be given.  On the Night Before the Test: Be sure to Drink plenty of water. Do not consume any caffeinated/decaffeinated beverages or chocolate 12 hours prior to your test. Do not take any antihistamines 12 hours prior to your test.   On the Day of the Test: Drink plenty of water until 1 hour prior to the test. Do not eat any  food 1 hour prior to test. You may take your regular medications prior to the test.  Take metoprolol (Lopressor) 100 mg  two hours prior to test. Don't take Propanolol on this day  If you take Furosemide/Hydrochlorothiazide/Spironolactone, please HOLD on the morning of the test. FEMALES- please wear underwire-free bra if available, avoid dresses & tight clothing       After the Test: Drink plenty of water. After receiving IV contrast, you may experience a mild flushed feeling. This is normal. On occasion, you may experience a mild rash up to 24 hours after the test. This is not dangerous. If this occurs, you can take Benadryl 25 mg and increase your fluid intake. If you experience trouble breathing, this can be serious. If it is severe call 911 IMMEDIATELY. If it is mild, please call our office.  We will call to schedule your test 2-4 weeks out understanding that some insurance companies will need an authorization prior to the service being performed.   For more information and frequently asked questions, please visit our website : [email or web address]  For non-scheduling related questions, please contact the cardiac imaging nurse navigator should you have any questions/concerns: Cardiac Imaging Nurse Navigators Direct Office Dial: [phone]   For scheduling needs, including cancellations and rescheduling, please call [place], [phone].

## 2023-05-20 NOTE — Progress Notes (Signed)
Cardiology Office Note:    Date:  05/20/2023   ID:  Shelly Burns, DOB [birth year], MRN [id number]  PCP:  [facility]  Cardiologist:  None  Electrophysiologist:  None   Referring MD: Donnal Debar, PA-C   Chief Complaint  Patient presents with   Chest Pain    History of Present Illness:    Shelly Burns is a 57 y.o. female with a hx of hypertension who is referred by Donnal Debar, PA for evaluation of chest pain.  She was seen in the ED on 04/27/2023 with chest pain.  CTA chest abdomen pelvis showed no acute abnormalities.  Troponins negative x 2.  She reports she woke up that morning and felt pain in center to right side of chest.  States that it was stabbing pain, worse with deep breath and certain movements.  Has improved but continues to have some chest pain that she does report seems to be worse with exertion.  Also does report dyspnea on exertion.  Denies any lightheadedness, syncope, lower extremity edema, or palpitations.  Never smoked.  No history of heart disease in immediate family.   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   metoprolol tartrate (LOPRESSOR) 100 MG tablet Take 1 tablet (100 mg total) by mouth once for 1 dose.   propranolol (INDERAL) 10 MG tablet Take 10 mg by mouth daily.     Allergies:   Patient has no known allergies.   Social History   Socioeconomic History   Marital  status: Married    Spouse name: Not on file   Number of children: Not on file   Years of education: Not on file   Highest education level: Not on file  Occupational History   Not on file  Tobacco Use   Smoking status: Never    Passive exposure: Current   Smokeless tobacco: Never  Vaping Use   Vaping status: Never Used  Substance and Sexual Activity   Alcohol use: Never   Drug use: Never   Sexual activity: Not on file  Other Topics Concern   Not on file  Social History Narrative   Not on file   Social Determinants of Health   Financial Resource Strain: Not on file  Food Insecurity: Not on file  Transportation Needs: Not on file  Physical Activity: Not on file  Stress: Not on file  Social Connections: Not on file     Family History: No history of heart disease in immediate family.  ROS:   Please see the history of present illness.     All other systems reviewed and are negative.  EKGs/Labs/Other Studies Reviewed:    The following studies were reviewed today:   EKG:   04/27/2023: Normal sinus rhythm, rate 92, LVH, poor R wave progression  Recent Labs: 04/27/2023: ALT 12; BUN 9; Creatinine, Ser 1.03; Hemoglobin 12.8; Platelets 310; Potassium 3.1; Sodium 140  Recent Lipid Panel No results found for: "CHOL", "TRIG", "HDL", "CHOLHDL", "VLDL", "LDLCALC", "LDLDIRECT"  Physical Exam:    VS:  BP (!) 144/94 (BP Location: Left Arm, Patient Position: Sitting, Cuff Size: Normal)   Pulse 74   Ht 5\' 8"  (1.727 m)   Wt 199 lb 3.2 oz (90.4 kg)   LMP 06/18/2015 Comment: partial hysterectomy  SpO2 100%   BMI 30.29 kg/m     Wt Readings from Last 3 Encounters:  05/20/23 199 lb 3.2 oz (90.4 kg)  04/27/23 195 lb (88.5 kg)  12/26/22 190 lb (86.2 kg)     GEN:  Well nourished, well developed in no acute distress HEENT: Normal NECK: No JVD; No carotid bruits LYMPHATICS: No lymphadenopathy CARDIAC: RRR, no murmurs, rubs, gallops RESPIRATORY:  Clear to auscultation without  rales, wheezing or rhonchi  ABDOMEN: Soft, non-tender, non-distended MUSCULOSKELETAL:  No edema; No deformity  SKIN: Warm and dry NEUROLOGIC:  Alert and oriented x 3 PSYCHIATRIC:  Normal affect   ASSESSMENT:    1. Precordial pain   2. Essential hypertension   3. Nonspecific abnormal electrocardiogram (ECG) (EKG)   4. Lipid screening    PLAN:    Chest pain: Atypical in description but does report can occur with exertion, suggesting possible angina.  Does have CAD risk factors (age, hypertension).  Recommend coronary CTA to evaluate for obstructive CAD.  Will hold home propranolol and give Lopressor 100 mg prior to study.  Did report pleuritic component, check ESR/CRP  Abnormal EKG: LVH, poor R wave progression on EKG.  Check echocardiogram to evaluate for structural heart disease  Hypertension: On lisinopril-HCTZ 20-25 mg daily and propranolol 10 mg.  BP elevated in clinic today reports she did not take her medication.  Asked to take meds and check BP twice daily for next week and let us know results  Lipid screening: Check lipid panel  RTC in 3 months  Medication Adjustments/Labs and Tests Ordered: Current medicines are reviewed at length with the patient today.  Concerns regarding medicines are outlined above.  Orders Placed This Encounter  Procedures   CT CORONARY MORPH W/CTA COR W/SCORE W/CA W/CM &/OR WO/CM   Basic Metabolic Panel (BMET)   Magnesium   Lipid panel   Sed Rate (ESR)   C-reactive protein   ECHOCARDIOGRAM COMPLETE   Meds ordered this encounter  Medications   metoprolol tartrate (LOPRESSOR) 100 MG tablet    Sig: Take 1 tablet (100 mg total) by mouth once for 1 dose.    Dispense:  1 tablet    Refill:  0    Patient Instructions  Medication Instructions:  Continue all current medications  *If you need a refill on your cardiac medications before your next appointment, please call your pharmacy*   Lab Work: Bmet, lipid panel, mg, ESR, CRP If you have  labs (blood work) drawn today and your tests are completely normal, you will receive your results only by: MyChart Message (if you have MyChart) OR A paper copy in the mail If you have any lab test that is abnormal or we need to change your treatment, we will call you to review the results.   Testing/Procedures: Echo Your physician has requested that you have an echocardiogram. Echocardiography is a painless test that uses sound waves to create images of your heart. It provides your doctor with information about the size and shape of your heart and how well your heart's chambers and valves are working. This procedure takes approximately one hour. There are  no restrictions for this procedure. Please do NOT wear cologne, perfume, aftershave, or lotions (deodorant is allowed). Please arrive 15 minutes prior to your appointment time.  Please note: We ask at that you not bring children with you during ultrasound (echo/ vascular) testing. Due to room size and safety concerns, children are not allowed in the ultrasound rooms during exams. Our front office staff cannot provide observation of children in our lobby area while testing is being conducted. An adult accompanying a patient to their appointment will only be allowed in the ultrasound room at the discretion of the ultrasound technician under special circumstances. We apologize for any inconvenience.    Follow-Up: At [facility], you and your health needs are our priority.  As part of our continuing mission to provide you with exceptional heart care, we have created designated Provider Care Teams.  These Care Teams include your primary Cardiologist (physician) and Advanced Practice Providers (APPs -  Physician Assistants and Nurse Practitioners) who all work together to provide you with the care you need, when you need it.  We recommend signing up for the patient portal called "MyChart".  Sign up information is provided on this After Visit  Summary.  MyChart is used to connect with patients for Virtual Visits (Telemedicine).  Patients are able to view lab/test results, encounter notes, upcoming appointments, etc.  Non-urgent messages can be sent to your provider as well.   To learn more about what you can do with MyChart, go to [email or web address].    Your next appointment:   March 11 @11 :40  Provider:   Dr. Bjorn Pippin  Other Instructions   Please check blood pressure twice a day for one week and send via mychart    Your cardiac CT will be scheduled at one of the below locations:   [facility] [address] [place], [place] [address] [phone]    If scheduled at [facility], please arrive at the [facility] and Children's Entrance (Entrance C2) of [facility] 30 minutes prior to test start time. You can use the FREE valet parking offered at entrance C (encouraged to control the heart rate for the test)  Proceed to the [facility] Radiology Department (first floor) to check-in and test prep.  All radiology patients and guests should use entrance C2 at [facility], accessed from [facility], even though the hospital's physical address listed is [address].      Please follow these instructions carefully (unless otherwise directed):  An IV will be required for this test and Nitroglycerin will be given.    On the Night Before the Test: Be sure to Drink plenty of water. Do not consume any caffeinated/decaffeinated beverages or chocolate 12 hours prior to your test. Do not take any antihistamines 12 hours prior to your test.   On the Day of the Test: Drink plenty of water until 1 hour prior to the test. Do not eat any food 1 hour prior to test. You may take your regular medications prior to the test.  Take metoprolol (Lopressor) 100 mg  two hours prior to test. Don't take Propanolol on this day  If you take  Furosemide/Hydrochlorothiazide/Spironolactone, please HOLD on the morning of the test. FEMALES- please wear underwire-free bra if available, avoid dresses & tight clothing       After the Test: Drink plenty of water. After receiving IV contrast, you may experience a mild flushed feeling. This is normal. On occasion, you  may experience a mild rash up to 24 hours after the test. This is not dangerous. If this occurs, you can take Benadryl 25 mg and increase your fluid intake. If you experience trouble breathing, this can be serious. If it is severe call 911 IMMEDIATELY. If it is mild, please call our office.  We will call to schedule your test 2-4 weeks out understanding that some insurance companies will need an authorization prior to the service being performed.   For more information and frequently asked questions, please visit our website : [email or web address]  For non-scheduling related questions, please contact the cardiac imaging nurse navigator should you have any questions/concerns: Cardiac Imaging Nurse Navigators Direct Office Dial: [phone]   For scheduling needs, including cancellations and rescheduling, please call [place], [phone].     Signed, Little Ishikawa, MD  05/20/2023 5:59 PM    [facility]

## 2023-05-21 LAB — BASIC METABOLIC PANEL
BUN/Creatinine Ratio: 14 (ref 9–23)
BUN: 15 mg/dL (ref 6–24)
CO2: 30 mmol/L — ABNORMAL HIGH (ref 20–29)
Calcium: 9.8 mg/dL (ref 8.7–10.2)
Chloride: 102 mmol/L (ref 96–106)
Creatinine, Ser: 1.07 mg/dL — ABNORMAL HIGH (ref 0.57–1.00)
Glucose: 101 mg/dL — ABNORMAL HIGH (ref 70–99)
Potassium: 3.6 mmol/L (ref 3.5–5.2)
Sodium: 145 mmol/L — ABNORMAL HIGH (ref 134–144)
eGFR: 61 mL/min/{1.73_m2} (ref >59)

## 2023-05-21 LAB — LIPID PANEL
Chol/HDL Ratio: 6 {ratio} — ABNORMAL HIGH (ref 0.0–4.4)
Cholesterol, Total: 262 mg/dL — ABNORMAL HIGH (ref 100–199)
HDL: 44 mg/dL (ref >39)
LDL Chol Calc (NIH): 194 mg/dL — ABNORMAL HIGH (ref 0–99)
Triglycerides: 130 mg/dL (ref 0–149)
VLDL Cholesterol Cal: 24 mg/dL (ref 5–40)

## 2023-05-21 LAB — C-REACTIVE PROTEIN: CRP: 12 mg/L — ABNORMAL HIGH (ref 0–10)

## 2023-05-21 LAB — MAGNESIUM: Magnesium: 2.2 mg/dL (ref 1.6–2.3)

## 2023-05-21 LAB — SEDIMENTATION RATE: Sed Rate: 2 mm/h (ref 0–40)

## 2023-05-23 ENCOUNTER — Other Ambulatory Visit: Payer: Self-pay

## 2023-05-23 DIAGNOSIS — E7800 Pure hypercholesterolemia, unspecified: Secondary | ICD-10-CM

## 2023-05-23 MED ORDER — ROSUVASTATIN CALCIUM 20 MG PO TABS: 20.0000 mg | ORAL_TABLET | Freq: Every day | ORAL | 3 refills | Status: AC

## 2023-05-30 ENCOUNTER — Encounter (HOSPITAL_COMMUNITY): Payer: Self-pay

## 2023-06-01 ENCOUNTER — Encounter: Payer: Self-pay | Admitting: Cardiology

## 2023-06-01 DIAGNOSIS — I10 Essential (primary) hypertension: Secondary | ICD-10-CM

## 2023-06-03 ENCOUNTER — Other Ambulatory Visit (HOSPITAL_COMMUNITY): Payer: Self-pay

## 2023-06-03 ENCOUNTER — Ambulatory Visit (HOSPITAL_COMMUNITY)
Admission: RE | Admit: 2023-06-03 | Discharge: 2023-06-03 | Disposition: A | Discharge status: Home / Self Care | Payer: Managed Care, Other (non HMO) | Source: Ambulatory Visit | Attending: Cardiology | Admitting: Cardiology

## 2023-06-03 DIAGNOSIS — R072 Precordial pain: Secondary | ICD-10-CM | POA: Diagnosis present

## 2023-06-03 MED ORDER — AMLODIPINE BESYLATE 5 MG PO TABS: 5.0000 mg | ORAL_TABLET | Freq: Every day | ORAL | 3 refills | Status: DC

## 2023-06-03 MED ORDER — NITROGLYCERIN 0.4 MG SL SUBL
SUBLINGUAL_TABLET | SUBLINGUAL | Status: AC
  Filled 2023-06-03: qty 2

## 2023-06-03 MED ORDER — IOHEXOL 350 MG/ML SOLN
100.0000 mL | Freq: Once | INTRAVENOUS | Status: AC | PRN
  Administered 2023-06-03: 100 mL via INTRAVENOUS

## 2023-06-03 MED ORDER — AMLODIPINE BESYLATE 5 MG PO TABS
5.0000 mg | ORAL_TABLET | Freq: Every day | ORAL | 3 refills | Status: DC
  Filled 2023-06-03: qty 30, 30d supply, fill #0

## 2023-06-03 MED ORDER — NITROGLYCERIN 0.4 MG SL SUBL
0.8000 mg | SUBLINGUAL_TABLET | Freq: Once | SUBLINGUAL | Status: AC
  Administered 2023-06-03: 0.8 mg via SUBLINGUAL

## 2023-06-03 NOTE — Telephone Encounter (Signed)
Called and spoke to patient and made her aware of Dr. Bjorn Pippin recommendations to start Amlodipine 5 mg daily and check her blood pressure for next 2 week and let us know via my chart of call office. Patient voiced an understanding.

## 2023-06-03 NOTE — Telephone Encounter (Signed)
BP elevated, recommend adding amlodipine 5 mg daily.  Check BP daily for next 2 weeks and let us know results

## 2023-06-03 NOTE — Addendum Note (Signed)
Addended by: Lizabeth Leyden R on: 06/03/2023 11:00 AM   Modules accepted: Orders

## 2023-06-20 ENCOUNTER — Ambulatory Visit: Payer: Managed Care, Other (non HMO) | Admitting: Cardiovascular Disease

## 2023-06-22 ENCOUNTER — Encounter: Payer: Self-pay | Admitting: Cardiology

## 2023-06-24 NOTE — Telephone Encounter (Signed)
 BP remains elevated, recommend increase amlodipine to 10 mg daily

## 2023-06-26 ENCOUNTER — Other Ambulatory Visit (HOSPITAL_COMMUNITY): Payer: Self-pay

## 2023-06-26 MED ORDER — AMLODIPINE BESYLATE 10 MG PO TABS
10.0000 mg | ORAL_TABLET | Freq: Every day | ORAL | 3 refills | Status: AC
Start: 2023-06-26 — End: 2023-09-24

## 2023-06-26 MED ORDER — AMLODIPINE BESYLATE 10 MG PO TABS
10.0000 mg | ORAL_TABLET | Freq: Every day | ORAL | 3 refills | Status: DC
  Filled 2023-06-26: qty 90, 90d supply, fill #0

## 2023-06-26 NOTE — Telephone Encounter (Signed)
 Called and made patient aware that per Dr. Bjorn Pippin, increase Amlodipine 10 mg daily. Patient verbalized an understanding.

## 2023-06-30 ENCOUNTER — Ambulatory Visit (HOSPITAL_COMMUNITY): Payer: No Typology Code available for payment source | Attending: Cardiovascular Disease

## 2023-06-30 DIAGNOSIS — R072 Precordial pain: Secondary | ICD-10-CM | POA: Insufficient documentation

## 2023-06-30 LAB — ECHOCARDIOGRAM COMPLETE: S' Lateral: 2.7 cm

## 2023-07-08 ENCOUNTER — Ambulatory Visit: Payer: 59 | Attending: Cardiology

## 2023-07-08 ENCOUNTER — Encounter: Payer: Self-pay | Admitting: *Deleted

## 2023-07-08 DIAGNOSIS — I493 Ventricular premature depolarization: Secondary | ICD-10-CM

## 2023-07-08 DIAGNOSIS — I10 Essential (primary) hypertension: Secondary | ICD-10-CM

## 2023-07-08 DIAGNOSIS — E7800 Pure hypercholesterolemia, unspecified: Secondary | ICD-10-CM

## 2023-07-08 DIAGNOSIS — Z13220 Encounter for screening for lipoid disorders: Secondary | ICD-10-CM

## 2023-07-08 NOTE — Telephone Encounter (Signed)
Called and made pt aware that per Dr. Conley Rolls for three days and BMET and Magnesium. Made patient aware otherwise per Dr. Bjorn Pippin no significant abnormalities.  Patient verbalized an understanding.

## 2023-07-08 NOTE — Telephone Encounter (Signed)
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  Recommend checking BMET/magnesium and Zio patch x 3 days to quantify PVC burden.  Otherwise no significant abnormalities on echocardiogram

## 2023-07-08 NOTE — Progress Notes (Unsigned)
Enrolled for [employer] to mail a ZIO XT long term holter monitor to the patients address on file.  

## 2023-07-11 DIAGNOSIS — I493 Ventricular premature depolarization: Secondary | ICD-10-CM

## 2023-07-22 ENCOUNTER — Ambulatory Visit (INDEPENDENT_AMBULATORY_CARE_PROVIDER_SITE_OTHER): Payer: 59 | Admitting: Cardiology

## 2023-07-22 VITALS — BP 108/74 | HR 91 | Ht 68.0 in | Wt 203.3 lb

## 2023-07-22 DIAGNOSIS — R9431 Abnormal electrocardiogram [ECG] [EKG]: Secondary | ICD-10-CM | POA: Diagnosis not present

## 2023-07-22 DIAGNOSIS — E785 Hyperlipidemia, unspecified: Secondary | ICD-10-CM | POA: Diagnosis not present

## 2023-07-22 DIAGNOSIS — R072 Precordial pain: Secondary | ICD-10-CM | POA: Diagnosis not present

## 2023-07-22 DIAGNOSIS — I10 Essential (primary) hypertension: Secondary | ICD-10-CM | POA: Diagnosis not present

## 2023-07-22 NOTE — Progress Notes (Signed)
 Cardiology Office Note:    Date:  07/22/2023   ID:  Shelly Burns, Shelly Burns [birth year], MRN [id number]  PCP:  Jeffery Iha, PA-C  Cardiologist:  None  Electrophysiologist:  None   Referring MD: [facility]   Chief Complaint  Patient presents with   Hyperlipidemia    History of Present Illness:    Shelly Burns is a 58 y.o. female with a hx of hypertension who presents for follow-up.  She was referred by Iha Jeffery, PA for evaluation of chest pain, initially seen 05/20/2023.  She was seen in the ED on 04/27/2023 with chest pain.  CTA chest abdomen pelvis showed no acute abnormalities.  Troponins negative x 2.    Coronary CTA on 06/02/2024 showed normal coronary arteries, calcium  score 0.  Echocardiogram 06/30/2023 showed frequent PVCs during study, EF 55 to 60%, grade 1 diastolic dysfunction, normal RV function, no significant valvular disease.  Zio patch x 5 days 06/2023 showed 1 episode of SVT lasting 5 beats, frequent PACs (7.2% of beats) and occasional supraventricular triplets (2.3%), rare PVCs.  Since last clinic visit, she reports she is doing well.  Reports no recent chest pain or dyspnea.  Reports occasional palpitations.  Occasional lightheadedness but denies any syncope.   Past Medical History:  Diagnosis Date   Arthritis    left hip   Hypertension    Pneumonia     Past Surgical History:  Procedure Laterality Date   ABDOMINAL HYSTERECTOMY     partial hysterectomy 2017   TOTAL HIP ARTHROPLASTY Left 09/04/2020   TOTAL HIP ARTHROPLASTY Left 09/04/2020   Procedure: LEFT TOTAL HIP ARTHROPLASTY ANTERIOR APPROACH;  Surgeon: Jerri Kay HERO, MD;  Location: [facility] OR;  Service: Orthopedics;  Laterality: Left;   TUBAL LIGATION      Current Medications: Current Meds  Medication Sig   amLODipine  (NORVASC ) 10 MG tablet Take 1 tablet (10 mg total) by mouth daily.   lisinopril -hydrochlorothiazide  (ZESTORETIC ) 20-25 MG tablet Take 1 tablet by mouth daily.    rosuvastatin  (CRESTOR ) 20 MG tablet Take 1 tablet (20 mg total) by mouth daily.   [DISCONTINUED] propranolol (INDERAL) 10 MG tablet Take 10 mg by mouth as needed.     Allergies:   Patient has no known allergies.   Social History   Socioeconomic History   Marital status: Married    Spouse name: Not on file   Number of children: Not on file   Years of education: Not on file   Highest education level: Not on file  Occupational History   Not on file  Tobacco Use   Smoking status: Never    Passive exposure: Current   Smokeless tobacco: Never  Vaping Use   Vaping status: Never Used  Substance and Sexual Activity   Alcohol use: Never   Drug use: Never   Sexual activity: Not on file  Other Topics Concern   Not on file  Social History Narrative   Not on file   Social Drivers of Health   Financial Resource Strain: Not on file  Food Insecurity: Not on file  Transportation Needs: Not on file  Physical Activity: Not on file  Stress: Not on file  Social Connections: Not on file     Family History: No history of heart disease in immediate family.  ROS:   Please see the history of present illness.     All other systems reviewed and are negative.  EKGs/Labs/Other Studies Reviewed:    The following studies were reviewed today:  EKG:   04/27/2023: Normal sinus rhythm, rate 92, LVH, poor R wave progression  Recent Labs: 04/27/2023: ALT 12; Hemoglobin 12.8; Platelets 310 05/20/2023: BUN 15; Creatinine, Ser 1.07; Magnesium  2.2; Potassium 3.6; Sodium 145  Recent Lipid Panel    Component Value Date/Time   CHOL 262 (H) 05/20/2023 1133   TRIG 130 05/20/2023 1133   HDL 44 05/20/2023 1133   CHOLHDL 6.0 (H) 05/20/2023 1133   LDLCALC 194 (H) 05/20/2023 1133    Physical Exam:    VS:  BP 108/74   Pulse 91   Ht 5' 8 (1.727 m)   Wt 203 lb 4.8 oz (92.2 kg)   LMP 06/18/2015 Comment: partial hysterectomy  SpO2 99%   BMI 30.91 kg/m     Wt Readings from Last 3 Encounters:   07/22/23 203 lb 4.8 oz (92.2 kg)  05/20/23 199 lb 3.2 oz (90.4 kg)  04/27/23 195 lb (88.5 kg)     GEN:  Well nourished, well developed in no acute distress HEENT: Normal NECK: No JVD; No carotid bruits LYMPHATICS: No lymphadenopathy CARDIAC: RRR, no murmurs, rubs, gallops RESPIRATORY:  Clear to auscultation without rales, wheezing or rhonchi  ABDOMEN: Soft, non-tender, non-distended MUSCULOSKELETAL:  No edema; No deformity  SKIN: Warm and dry NEUROLOGIC:  Alert and oriented x 3 PSYCHIATRIC:  Normal affect   ASSESSMENT:    1. Hyperlipidemia, unspecified hyperlipidemia type   2. Essential hypertension   3. Nonspecific abnormal electrocardiogram (ECG) (EKG)   4. Precordial pain     PLAN:    Chest pain: Atypical in description.  Coronary CTA on 06/03/2023 showed normal coronary arteries, calcium  score 0.  Echocardiogram 06/30/2023 showed frequent PVCs during study, EF 55 to 60%, grade 1 diastolic dysfunction, normal RV function, no significant valvular disease.   -Denies any recent chest pain  Frequent PACs: Zio patch x 5 days 06/2023 showed 1 episode of SVT lasting 5 beats, frequent PACs (7.2% of beats) and occasional supraventricular triplets (2.3%), rare PVCs. -Denies any palpitations, no treatment indicated at this time  Abnormal EKG: LVH, poor R wave progression on EKG.  Echocardiogram 06/30/2023 showed frequent PVCs during study, EF 55 to 60%, grade 1 diastolic dysfunction, normal RV function, no significant valvular disease.    Hypertension: On lisinopril -HCTZ 20-25 mg daily and amlodipine  10mg  daily.  Appears controlled.  Check BMET, magnesium   Hyperlipidemia: LDL 194 on 05/20/2023.  Started rosuvastatin  20 mg daily.  Repeat lipid panel  RTC in 6 months  Medication Adjustments/Labs and Tests Ordered: Current medicines are reviewed at length with the patient today.  Concerns regarding medicines are outlined above.  Orders Placed This Encounter  Procedures   Basic  metabolic panel   Magnesium    Lipid panel   No orders of the defined types were placed in this encounter.   Patient Instructions  MEDICATION; STOP PROPRANOLOL   *If you need a refill on your cardiac medications before your next appointment, please call your pharmacy*  Lab Work: Your physician recommends that you return for FASTING lab work in 2-3 weeks: LIPIDS, BMP and MAG  If you have labs (blood work) drawn today and your tests are completely normal, you will receive your results only by: MyChart Message (if you have MyChart) OR A paper copy in the mail If you have any lab test that is abnormal or we need to change your treatment, we will call you to review the results.  Follow-Up: At [facility], you and your health needs are our priority.  As part of our continuing mission to provide you with exceptional heart care, we have created designated Provider Care Teams.  These Care Teams include your primary Cardiologist (physician) and Advanced Practice Providers (APPs -  Physician Assistants and Nurse Practitioners) who all work together to provide you with the care you need, when you need it.  We recommend signing up for the patient portal called MyChart.  Sign up information is provided on this After Visit Summary.  MyChart is used to connect with patients for Virtual Visits (Telemedicine).  Patients are able to view lab/test results, encounter notes, upcoming appointments, etc.  Non-urgent messages can be sent to your provider as well.   To learn more about what you can do with MyChart, go to [email or web address].    Your next appointment:   6 month(s)  Provider:   Dr. Kate  Other Instructions        Signed, Lonni LITTIE Kate, MD  07/22/2023 2:43 PM    [facility]

## 2023-07-22 NOTE — Patient Instructions (Addendum)
 MEDICATION; STOP PROPRANOLOL   *If you need a refill on your cardiac medications before your next appointment, please call your pharmacy*  Lab Work: Your physician recommends that you return for FASTING lab work in 2-3 weeks: LIPIDS, BMP and MAG  If you have labs (blood work) drawn today and your tests are completely normal, you will receive your results only by: MyChart Message (if you have MyChart) OR A paper copy in the mail If you have any lab test that is abnormal or we need to change your treatment, we will call you to review the results.  Follow-Up: At [facility], you and your health needs are our priority.  As part of our continuing mission to provide you with exceptional heart care, we have created designated Provider Care Teams.  These Care Teams include your primary Cardiologist (physician) and Advanced Practice Providers (APPs -  Physician Assistants and Nurse Practitioners) who all work together to provide you with the care you need, when you need it.  We recommend signing up for the patient portal called MyChart.  Sign up information is provided on this After Visit Summary.  MyChart is used to connect with patients for Virtual Visits (Telemedicine).  Patients are able to view lab/test results, encounter notes, upcoming appointments, etc.  Non-urgent messages can be sent to your provider as well.   To learn more about what you can do with MyChart, go to [email or web address].    Your next appointment:   6 month(s)  Provider:   Dr. Kate  Other Instructions

## 2023-08-08 ENCOUNTER — Other Ambulatory Visit: Payer: Self-pay

## 2023-08-08 DIAGNOSIS — E7800 Pure hypercholesterolemia, unspecified: Secondary | ICD-10-CM

## 2023-08-08 DIAGNOSIS — I10 Essential (primary) hypertension: Secondary | ICD-10-CM

## 2023-08-08 DIAGNOSIS — Z13220 Encounter for screening for lipoid disorders: Secondary | ICD-10-CM

## 2023-08-08 LAB — BASIC METABOLIC PANEL
BUN/Creatinine Ratio: 13 (ref 9–23)
BUN: 12 mg/dL (ref 6–24)
CO2: 25 mmol/L (ref 20–29)
Calcium: 9.8 mg/dL (ref 8.7–10.2)
Chloride: 106 mmol/L (ref 96–106)
Creatinine, Ser: 0.94 mg/dL (ref 0.57–1.00)
Glucose: 101 mg/dL — ABNORMAL HIGH (ref 70–99)
Potassium: 3.7 mmol/L (ref 3.5–5.2)
Sodium: 146 mmol/L — ABNORMAL HIGH (ref 134–144)
eGFR: 71 mL/min/{1.73_m2} (ref >59)

## 2023-08-08 LAB — MAGNESIUM: Magnesium: 2.4 mg/dL — ABNORMAL HIGH (ref 1.6–2.3)

## 2023-08-12 ENCOUNTER — Other Ambulatory Visit: Payer: Self-pay | Admitting: *Deleted

## 2023-08-12 ENCOUNTER — Encounter: Payer: Self-pay | Admitting: *Deleted

## 2023-08-12 DIAGNOSIS — I10 Essential (primary) hypertension: Secondary | ICD-10-CM

## 2023-08-26 ENCOUNTER — Ambulatory Visit: Payer: Managed Care, Other (non HMO) | Admitting: Cardiology

## 2024-06-14 ENCOUNTER — Telehealth: Payer: Self-pay | Admitting: Cardiology

## 2024-06-14 NOTE — Telephone Encounter (Signed)
 When patient was called to schedule recall, she said she doesn't plan on coming back.
